# Patient Record
Sex: Male | Born: 1965 | Race: Black or African American | Hispanic: No | Marital: Single | Smoking: Former smoker
Health system: Southern US, Community
[De-identification: ages and names within clinical notes are randomized; demographics above are authoritative.]

## PROBLEM LIST (undated history)

## (undated) DIAGNOSIS — E7800 Pure hypercholesterolemia, unspecified: Secondary | ICD-10-CM

## (undated) DIAGNOSIS — Z8042 Family history of malignant neoplasm of prostate: Secondary | ICD-10-CM

## (undated) DIAGNOSIS — Z803 Family history of malignant neoplasm of breast: Secondary | ICD-10-CM

## (undated) DIAGNOSIS — M1990 Unspecified osteoarthritis, unspecified site: Secondary | ICD-10-CM

## (undated) DIAGNOSIS — M109 Gout, unspecified: Secondary | ICD-10-CM

## (undated) DIAGNOSIS — Z8041 Family history of malignant neoplasm of ovary: Secondary | ICD-10-CM

## (undated) DIAGNOSIS — S31139A Puncture wound of abdominal wall without foreign body, unspecified quadrant without penetration into peritoneal cavity, initial encounter: Secondary | ICD-10-CM

## (undated) DIAGNOSIS — W3400XA Accidental discharge from unspecified firearms or gun, initial encounter: Secondary | ICD-10-CM

## (undated) DIAGNOSIS — M6740 Ganglion, unspecified site: Secondary | ICD-10-CM

## (undated) HISTORY — DX: Unspecified osteoarthritis, unspecified site: M19.90

## (undated) HISTORY — PX: OTHER SURGICAL HISTORY: SHX169

## (undated) HISTORY — DX: Family history of malignant neoplasm of breast: Z80.3

## (undated) HISTORY — DX: Family history of malignant neoplasm of ovary: Z80.41

## (undated) HISTORY — DX: Family history of malignant neoplasm of prostate: Z80.42

## (undated) HISTORY — PX: APPENDECTOMY: SHX54

---

## 1998-03-03 ENCOUNTER — Emergency Department (HOSPITAL_COMMUNITY): Admission: EM | Admit: 1998-03-03 | Discharge: 1998-03-03 | Payer: Self-pay | Admitting: Emergency Medicine

## 1998-04-05 ENCOUNTER — Emergency Department (HOSPITAL_COMMUNITY): Admission: EM | Admit: 1998-04-05 | Discharge: 1998-04-05 | Payer: Self-pay

## 1998-07-05 ENCOUNTER — Encounter: Admission: RE | Admit: 1998-07-05 | Discharge: 1998-10-03 | Payer: Self-pay | Admitting: Family Medicine

## 1999-08-04 ENCOUNTER — Encounter: Payer: Self-pay | Admitting: General Surgery

## 1999-08-04 ENCOUNTER — Inpatient Hospital Stay (HOSPITAL_COMMUNITY): Admission: EM | Admit: 1999-08-04 | Discharge: 1999-08-25 | Payer: Self-pay | Admitting: General Surgery

## 1999-08-07 ENCOUNTER — Encounter: Payer: Self-pay | Admitting: General Surgery

## 1999-08-08 ENCOUNTER — Encounter: Payer: Self-pay | Admitting: General Surgery

## 1999-08-09 ENCOUNTER — Encounter: Payer: Self-pay | Admitting: *Deleted

## 1999-08-11 ENCOUNTER — Encounter: Payer: Self-pay | Admitting: General Surgery

## 1999-08-19 ENCOUNTER — Encounter: Payer: Self-pay | Admitting: Urology

## 1999-09-13 ENCOUNTER — Emergency Department (HOSPITAL_COMMUNITY): Admission: EM | Admit: 1999-09-13 | Discharge: 1999-09-13 | Payer: Self-pay | Admitting: *Deleted

## 1999-09-26 ENCOUNTER — Ambulatory Visit (HOSPITAL_COMMUNITY): Admission: RE | Admit: 1999-09-26 | Discharge: 1999-09-26 | Payer: Self-pay

## 1999-10-05 ENCOUNTER — Encounter: Payer: Self-pay | Admitting: Emergency Medicine

## 1999-10-05 ENCOUNTER — Emergency Department (HOSPITAL_COMMUNITY): Admission: EM | Admit: 1999-10-05 | Discharge: 1999-10-05 | Payer: Self-pay | Admitting: Emergency Medicine

## 1999-10-24 ENCOUNTER — Ambulatory Visit (HOSPITAL_COMMUNITY): Admission: RE | Admit: 1999-10-24 | Discharge: 1999-10-24 | Payer: Self-pay

## 2000-08-28 ENCOUNTER — Ambulatory Visit (HOSPITAL_BASED_OUTPATIENT_CLINIC_OR_DEPARTMENT_OTHER): Admission: RE | Admit: 2000-08-28 | Discharge: 2000-08-28 | Payer: Self-pay | Admitting: *Deleted

## 2001-05-29 ENCOUNTER — Emergency Department (HOSPITAL_COMMUNITY): Admission: EM | Admit: 2001-05-29 | Discharge: 2001-05-29 | Payer: Self-pay | Admitting: Emergency Medicine

## 2001-09-05 ENCOUNTER — Emergency Department (HOSPITAL_COMMUNITY): Admission: EM | Admit: 2001-09-05 | Discharge: 2001-09-05 | Payer: Self-pay | Admitting: Emergency Medicine

## 2001-10-22 DIAGNOSIS — S31139A Puncture wound of abdominal wall without foreign body, unspecified quadrant without penetration into peritoneal cavity, initial encounter: Secondary | ICD-10-CM

## 2001-10-22 DIAGNOSIS — W3400XA Accidental discharge from unspecified firearms or gun, initial encounter: Secondary | ICD-10-CM

## 2001-10-22 HISTORY — DX: Accidental discharge from unspecified firearms or gun, initial encounter: W34.00XA

## 2001-10-22 HISTORY — DX: Puncture wound of abdominal wall without foreign body, unspecified quadrant without penetration into peritoneal cavity, initial encounter: S31.139A

## 2001-10-22 HISTORY — PX: OTHER SURGICAL HISTORY: SHX169

## 2010-07-25 ENCOUNTER — Emergency Department (HOSPITAL_COMMUNITY): Admission: EM | Admit: 2010-07-25 | Discharge: 2010-07-25 | Payer: Self-pay | Admitting: Family Medicine

## 2011-01-04 LAB — POCT RAPID STREP A (OFFICE): Streptococcus, Group A Screen (Direct): NEGATIVE
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
## 2011-03-09 NOTE — Op Note (Signed)
[place]. [facility]  Patient:    Trevor Bean, Trevor Bean                      MRN: [id number] Proc. Date: 08/28/00 Adm. Date:  [id number] Attending:  Kendell Bane                           Operative Report  PREOPERATIVE DIAGNOSIS:  Chronic dislocation carpometacarpal joint, left thumb.  POSTOPERATIVE DIAGNOSIS:  Chronic dislocation carpometacarpal joint, left thumb.  OPERATION:  Open reduction and pinning carpometacarpal joint, left thumb.  SURGEON:  Lowell Bouton, M.D.  ANESTHESIA:  General  OPERATIVE FINDINGS:  The patient had mild degenerative changes at the [facility] joint.  The base of the metacarpal was 90% dislocated.  DESCRIPTION OF PROCEDURE:  Under general anesthesia with a tourniquet on the left arm, the left hand was prepped and draped in the usual fashion and a curved incision was made dorsally over the [facility] joint.  Blunt dissection was carried through the subcutaneous tissues and bleeding points were coagulated. Sharp dissection was carried down through the capsule longitudinally at the [facility] joint.  The capsule was elevated sharply, radially and ulnarly off of the trapezium and the [facility] joint was examined.  The metacarpal was 90% subluxed from the joint.  An arthrotomy was made at the [facility] joint and Freer elevator inserted to examine the joint.  There was some mild degenerative changes.  The capsule was completely released and the first metacarpal was reduced and pinned with 62 K-wire times two.  X-rays showed good alignment of the joint. The pins were bent over and left protruding from the skin.  The wound was irrigated with saline and closed with 4-0 Vicryl in the capsule and a 3-0 subcuticular Prolene in the skin.  Steri-Strips were applied followed by sterile dressings and thumb Spica splint. The patient tolerated the procedure well and went to the recovery room awake and stable in good condition. DD:  08/28/00 TD:   08/29/00 Job: [id number]
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
## 2011-06-07 ENCOUNTER — Emergency Department (HOSPITAL_COMMUNITY): Payer: Self-pay

## 2011-06-07 ENCOUNTER — Emergency Department (HOSPITAL_COMMUNITY)
Admission: EM | Admit: 2011-06-07 | Discharge: 2011-06-07 | Disposition: A | Discharge status: Home / Self Care | Payer: Self-pay | Attending: Emergency Medicine | Admitting: Emergency Medicine

## 2011-06-07 DIAGNOSIS — K921 Melena: Secondary | ICD-10-CM | POA: Insufficient documentation

## 2011-06-07 DIAGNOSIS — K5900 Constipation, unspecified: Secondary | ICD-10-CM | POA: Insufficient documentation

## 2011-06-07 DIAGNOSIS — R209 Unspecified disturbances of skin sensation: Secondary | ICD-10-CM | POA: Insufficient documentation

## 2011-06-07 DIAGNOSIS — R29898 Other symptoms and signs involving the musculoskeletal system: Secondary | ICD-10-CM | POA: Insufficient documentation

## 2011-06-07 DIAGNOSIS — E7800 Pure hypercholesterolemia, unspecified: Secondary | ICD-10-CM | POA: Insufficient documentation

## 2011-06-07 DIAGNOSIS — I10 Essential (primary) hypertension: Secondary | ICD-10-CM | POA: Insufficient documentation

## 2011-06-07 LAB — POCT I-STAT, CHEM 8
BUN: 16 mg/dL (ref 6–23)
Calcium, Ion: 1.14 mmol/L (ref 1.12–1.32)
Chloride: 106 mEq/L (ref 96–112)
Creatinine, Ser: 1 mg/dL (ref 0.50–1.35)
Glucose, Bld: 66 mg/dL — ABNORMAL LOW (ref 70–99)
HCT: 41 % (ref 39.0–52.0)
Hemoglobin: 13.9 g/dL (ref 13.0–17.0)
Potassium: 3.5 mEq/L (ref 3.5–5.1)
Sodium: 142 mEq/L (ref 135–145)
TCO2: 23 mmol/L (ref 0–100)

## 2011-08-12 ENCOUNTER — Emergency Department (HOSPITAL_COMMUNITY)
Admission: EM | Admit: 2011-08-12 | Discharge: 2011-08-13 | Discharge status: Against Medical Advice | Payer: Self-pay | Attending: Emergency Medicine | Admitting: Emergency Medicine

## 2011-08-12 DIAGNOSIS — Z0389 Encounter for observation for other suspected diseases and conditions ruled out: Secondary | ICD-10-CM | POA: Insufficient documentation

## 2012-01-19 ENCOUNTER — Emergency Department (HOSPITAL_COMMUNITY)
Admission: EM | Admit: 2012-01-19 | Discharge: 2012-01-20 | Disposition: A | Discharge status: Home / Self Care | Payer: Self-pay | Attending: Emergency Medicine | Admitting: Emergency Medicine

## 2012-01-19 ENCOUNTER — Encounter (HOSPITAL_COMMUNITY): Payer: Self-pay | Admitting: Emergency Medicine

## 2012-01-19 ENCOUNTER — Emergency Department (HOSPITAL_COMMUNITY): Payer: Self-pay

## 2012-01-19 DIAGNOSIS — S43402A Unspecified sprain of left shoulder joint, initial encounter: Secondary | ICD-10-CM

## 2012-01-19 DIAGNOSIS — IMO0002 Reserved for concepts with insufficient information to code with codable children: Secondary | ICD-10-CM | POA: Insufficient documentation

## 2012-01-19 NOTE — ED Notes (Signed)
PT. DID NOT LEAVE ER , SLEEPING AT WAITING AREA .
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
## 2012-01-20 MED ORDER — IBUPROFEN 600 MG PO TABS: 600.0000 mg | ORAL_TABLET | Freq: Four times a day (QID) | ORAL | Status: DC | PRN

## 2012-01-20 MED ORDER — OXYCODONE-ACETAMINOPHEN 5-325 MG PO TABS
1.0000 | ORAL_TABLET | Freq: Once | ORAL | Status: AC
  Administered 2012-01-20: 1 via ORAL
  Filled 2012-01-20: qty 1

## 2012-01-20 MED ORDER — HYDROCODONE-ACETAMINOPHEN 5-500 MG PO TABS: 1.0000 | ORAL_TABLET | Freq: Four times a day (QID) | ORAL | Status: DC | PRN

## 2012-01-20 NOTE — Discharge Instructions (Signed)
Take ibuprofen for pain. Take vicodin as prescribed as needed for severe pain. Ice your shoulder. Keep it in the sling. No lifting anything over 1lb. Follow up closely with orthopedics as referred. Return if high fever, or any new concerning symptoms  Shoulder Pain The shoulder is a ball and socket joint. The muscles and tendons (rotator cuff) are what keep the shoulder in its joint and stable. This collection of muscles and tendons holds in the head (ball) of the humerus (upper arm bone) in the fossa (cup) of the scapula (shoulder blade). Today no reason was found for your shoulder pain. Often pain in the shoulder may be treated conservatively with temporary immobilization. For example, holding the shoulder in one place using a sling for rest. Physical therapy may be needed if problems continue. HOME CARE INSTRUCTIONS   Apply ice to the sore area for 15 to 20 minutes, 3 to 4 times per day for the first 2 days. Put the ice in a plastic bag. Place a towel between the bag of ice and your skin.   If you have or were given a shoulder sling and straps, do not remove for as long as directed by your caregiver or until you see a caregiver for a follow-up examination. If you need to remove it to shower or bathe, move your arm as little as possible.   Sleep on several pillows at night to lessen swelling and pain.   Only take over-the-counter or prescription medicines for pain, discomfort, or fever as directed by your caregiver.   Keep any follow-up appointments in order to avoid any type of permanent shoulder disability or chronic pain problems.  SEEK MEDICAL CARE IF:   Pain in your shoulder increases or new pain develops in your arm, hand, or fingers.   Your hand or fingers are colder than your other hand.   You do not obtain pain relief with the medications or your pain becomes worse.  SEEK IMMEDIATE MEDICAL CARE IF:   Your arm, hand, or fingers are numb or tingling.   Your arm, hand, or fingers  are swollen, painful, or turn white or blue.   You develop chest pain or shortness of breath.  MAKE SURE YOU:   Understand these instructions.   Will watch your condition.   Will get help right away if you are not doing well or get worse.  Document Released: 07/18/2005 Document Revised: 09/27/2011 Document Reviewed: 09/22/2011 [facility] Patient Information 2012 [place], [place].
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
## 2012-01-21 NOTE — ED Provider Notes (Signed)
Medical screening examination/treatment/procedure(s) were performed by non-physician practitioner and as supervising physician I was immediately available for consultation/collaboration.   Tiffane Sheldon M Katyra Tomassetti, DO 01/21/12 0647 

## 2012-01-26 ENCOUNTER — Emergency Department (HOSPITAL_COMMUNITY)
Admission: EM | Admit: 2012-01-26 | Discharge: 2012-01-26 | Disposition: A | Discharge status: Home Health | Payer: Self-pay | Attending: Emergency Medicine | Admitting: Emergency Medicine

## 2012-01-26 ENCOUNTER — Encounter (HOSPITAL_COMMUNITY): Payer: Self-pay | Admitting: Nurse Practitioner

## 2012-01-26 DIAGNOSIS — S4990XA Unspecified injury of shoulder and upper arm, unspecified arm, initial encounter: Secondary | ICD-10-CM

## 2012-01-26 DIAGNOSIS — S46909A Unspecified injury of unspecified muscle, fascia and tendon at shoulder and upper arm level, unspecified arm, initial encounter: Secondary | ICD-10-CM | POA: Insufficient documentation

## 2012-01-26 DIAGNOSIS — S4980XA Other specified injuries of shoulder and upper arm, unspecified arm, initial encounter: Secondary | ICD-10-CM | POA: Insufficient documentation

## 2012-01-26 DIAGNOSIS — Y998 Other external cause status: Secondary | ICD-10-CM | POA: Insufficient documentation

## 2012-01-26 DIAGNOSIS — X500XXA Overexertion from strenuous movement or load, initial encounter: Secondary | ICD-10-CM | POA: Insufficient documentation

## 2012-01-26 DIAGNOSIS — Y9383 Activity, rough housing and horseplay: Secondary | ICD-10-CM | POA: Insufficient documentation

## 2012-01-26 HISTORY — DX: Puncture wound of abdominal wall without foreign body, unspecified quadrant without penetration into peritoneal cavity, initial encounter: S31.139A

## 2012-01-26 HISTORY — DX: Accidental discharge from unspecified firearms or gun, initial encounter: W34.00XA

## 2012-01-26 MED ORDER — HYDROCODONE-ACETAMINOPHEN 5-325 MG PO TABS: 1.0000 | ORAL_TABLET | ORAL | Status: AC | PRN

## 2012-01-26 NOTE — ED Notes (Addendum)
States he was horseplaying and his L arm was injured earlier this week. Pain in L shoulder since, "now there feels like a knot in my shoulder." reports he was seen here earlier this week for same. Cms intact
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
## 2012-01-26 NOTE — Discharge Instructions (Signed)
Rotator Cuff Injury  The rotator cuff is the collective set of muscles and tendons that make up the stabilizing unit of your shoulder. This unit holds in the ball of the humerus (upper arm bone) in the socket of the scapula (shoulder blade). Injuries to this stabilizing unit most commonly come from sports or activities that cause the arm to be moved repeatedly over the head. Examples of this include throwing, weight lifting, swimming, racquet sports, or an injury such as falling on your arm. Chronic (longstanding) irritation of this unit can cause inflammation (soreness), bursitis, and eventual damage to the tendons to the point of rupture (tear). An acute (sudden) injury of the rotator cuff can result in a partial or complete tear. You may need surgery with complete tears. Small or partial rotator cuff tears may be treated conservatively with temporary immobilization, exercises and rest. Physical therapy may be needed.  HOME CARE INSTRUCTIONS    Apply ice to the injury for 15 to 20 minutes 3 to 4 times per day for the first 2 days. Put the ice in a plastic bag and place a towel between the bag of ice and your skin.   If you have a shoulder immobilizer (sling and straps), do not remove it for as long as directed by your caregiver or until you see a caregiver for a follow-up examination. If you need to remove it, move your arm as little as possible.   You may want to sleep on several pillows or in a recliner at night to lessen swelling and pain.   Only take over-the-counter or prescription medicines for pain, discomfort, or fever as directed by your caregiver.   Do simple hand squeezing exercises with a soft rubber ball to decrease hand swelling.  SEEK MEDICAL CARE IF:    Pain in your shoulder increases or new pain or numbness develops in your arm, hand, or fingers.   Your hand or fingers are colder than your other hand.  SEEK IMMEDIATE MEDICAL CARE IF:    Your arm, hand, or fingers are numb or  tingling.   Your arm, hand, or fingers are increasingly swollen and painful, or turn white or blue.  Document Released: 10/05/2000 Document Revised: 09/27/2011 Document Reviewed: 09/28/2008  [employer] Patient Information 2012 [employer].

## 2012-11-16 ENCOUNTER — Encounter (HOSPITAL_COMMUNITY): Payer: Self-pay | Admitting: *Deleted

## 2012-11-16 ENCOUNTER — Emergency Department (INDEPENDENT_AMBULATORY_CARE_PROVIDER_SITE_OTHER)
Admission: EM | Admit: 2012-11-16 | Discharge: 2012-11-16 | Disposition: A | Discharge status: Home / Self Care | Payer: Self-pay | Source: Home / Self Care | Attending: Family Medicine | Admitting: Family Medicine

## 2012-11-16 ENCOUNTER — Emergency Department (INDEPENDENT_AMBULATORY_CARE_PROVIDER_SITE_OTHER): Payer: Self-pay

## 2012-11-16 DIAGNOSIS — M109 Gout, unspecified: Secondary | ICD-10-CM

## 2012-11-16 LAB — URIC ACID: Uric Acid, Serum: 5.2 mg/dL (ref 4.0–7.8)

## 2012-11-16 MED ORDER — INDOMETHACIN 50 MG PO CAPS: 50.0000 mg | ORAL_CAPSULE | Freq: Three times a day (TID) | ORAL | Status: DC

## 2012-11-16 MED ORDER — COLCHICINE 0.6 MG PO TABS: 0.6000 mg | ORAL_TABLET | Freq: Two times a day (BID) | ORAL | Status: DC
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
## 2012-11-29 ENCOUNTER — Telehealth (HOSPITAL_COMMUNITY): Payer: Self-pay | Admitting: Family Medicine

## 2012-11-29 NOTE — ED Notes (Signed)
Patient called today stating he needed something to help with the pain.  He states he was unable to afford the medication written on 11/16/2012.  Chart reviewed by Dr Artis Flock.  He stated for patient to fill the indomethacin.  Dr Artis Flock states it is inexpensive and will help with inflammation and pain.  Patient made aware and expressed understanding.  Patient notified to try [employer], [employer] or [employer] [employer] since their prices are usually the lowest.

## 2013-11-22 ENCOUNTER — Encounter (HOSPITAL_COMMUNITY): Payer: Self-pay | Admitting: Emergency Medicine

## 2013-11-22 ENCOUNTER — Emergency Department (INDEPENDENT_AMBULATORY_CARE_PROVIDER_SITE_OTHER)
Admission: EM | Admit: 2013-11-22 | Discharge: 2013-11-22 | Disposition: A | Discharge status: Home / Self Care | Payer: Self-pay | Source: Home / Self Care | Attending: Emergency Medicine | Admitting: Emergency Medicine

## 2013-11-22 DIAGNOSIS — M2550 Pain in unspecified joint: Secondary | ICD-10-CM

## 2013-11-22 DIAGNOSIS — K0401 Reversible pulpitis: Secondary | ICD-10-CM

## 2013-11-22 MED ORDER — METRONIDAZOLE 500 MG PO TABS: 500.0000 mg | ORAL_TABLET | Freq: Three times a day (TID) | ORAL | Status: DC

## 2013-11-22 MED ORDER — MELOXICAM 15 MG PO TABS: 15.0000 mg | ORAL_TABLET | Freq: Every day | ORAL | Status: DC

## 2013-11-22 MED ORDER — AMOXICILLIN 500 MG PO CAPS: 500.0000 mg | ORAL_CAPSULE | Freq: Three times a day (TID) | ORAL | Status: DC

## 2013-11-22 MED ORDER — TRAMADOL HCL 50 MG PO TABS: 100.0000 mg | ORAL_TABLET | Freq: Three times a day (TID) | ORAL | Status: DC | PRN

## 2013-11-22 NOTE — ED Notes (Signed)
Pt        Reports        Both  Feet         Are  painfull  And     Swelling        As  Well as  Both  Hands - he  Reports   As  Well that  He  Has  A  Toothache  With  Pain /  Swelling to r  Side  Face         These  Symptoms  Have  Been present      For      sev  Weeks

## 2013-11-22 NOTE — ED Provider Notes (Signed)
Chief Complaint    Chief Complaint  Patient presents with  . Foot Pain    History of Present Illness     Trevor Bean is a 48 year old male who comes in today with a long-standing history of pain in his hands and feet. He was here about a year ago with the same thing. At that time he was diagnosed as having gout. Despite the diagnosis, his uric acid level was normal. His x-ray of his foot was normal. He has pain in the MTP joints of both feet and the MCP joints of both hands and this is been going on for years. It hurts to stand and use his hands. He has been let go from his job due to pain. He notes swelling, stiffness, and morning stiffness lasting for hours to all day at a time. He denies any other joint pain.  His other complaint has been a toothache in his right upper dentition. This appears to involve 2 teeth, both right, upper, bicuspids. There is some swelling of the gingiva in that area, but no purulent drainage. He has no difficulty swallowing or breathing.  Review of Systems     Other than as noted above, the patient denies any of the following symptoms: Systemic:  No fevers, chills, sweats, or muscle aches.  No weight loss.  Musculoskeletal:  No joint pain, arthritis, bursitis, swelling, back pain, or neck pain. Neurological:  No muscular weakness, paresthesias, headache, or trouble with speech or coordination.  No dizziness.  [place]    Past medical history, family history, social history, meds, and allergies were reviewed.    Physical Exam    Vital signs:  BP 121/81  Pulse 79  Temp(Src) 98.5 F (36.9 C) (Oral)  Resp 19  SpO2 96% Gen:  Alert and oriented times 3.  In no distress. HEENT: He has several decayed teeth, both upper left premolars with some swelling of the gingiva as well. Musculoskeletal: Exam of his joints reveals pain to palpation in the MCP joints of the hands and MTP joints of the toes. There is no obvious swelling or deformity. No erythema or heat.   Otherwise, all joints had a full a ROM with no swelling, bruising or deformity.  No edema, pulses full. Extremities were warm and pink.  Capillary refill was brisk.  Skin:  Clear, warm and dry.  No rash. Neuro:  Alert and oriented times 3.  Muscle strength was normal.  Sensation was intact to light touch.   Assessment    The primary encounter diagnosis was Pulpitis. A diagnosis of Arthralgia was also pertinent to this visit.  I don't think he has acute gout. Differential diagnosis includes chronic gouty arthritis, pseudogout, osteoarthritis, or rheumatoid arthritis. He needs a complete rheumatological workup. I have referred [facility] to the [facility] and [facility] for further evaluation.  Plan   1.  Meds:  The following meds were prescribed:   Discharge Medication List as of 11/22/2013  3:13 PM    START taking these medications   Details  amoxicillin (AMOXIL) 500 MG capsule Take 1 capsule (500 mg total) by mouth 3 (three) times daily., Starting 11/22/2013, Until Discontinued, Normal    meloxicam (MOBIC) 15 MG tablet Take 1 tablet (15 mg total) by mouth daily., Starting 11/22/2013, Until Discontinued, Normal    metroNIDAZOLE (FLAGYL) 500 MG tablet Take 1 tablet (500 mg total) by mouth 3 (three) times daily., Starting 11/22/2013, Until Discontinued, Normal    traMADol (ULTRAM) 50 MG tablet Take  2 tablets (100 mg total) by mouth every 8 (eight) hours as needed., Starting 11/22/2013, Until Discontinued, Normal        2.  Patient Education/Counseling:  The patient was given appropriate handouts, self care instructions, and instructed in symptomatic relief, including rest and activity, and elevation.  3.  Follow up:  The patient was told to follow up here if no better in 3 to 4 days, or sooner if becoming worse in any way, and given some red flag symptoms such as worsening pain or new neurological symptoms which would prompt immediate return.  Follow up here as needed. Suggest he needs to see a  dentist as soon as possible. He does not have any funds and cannot afford one. He was given a list of dental resources.     Harden Mo, MD 11/22/13 2049

## 2013-11-22 NOTE — Discharge Instructions (Signed)
Arthralgia °Your caregiver has diagnosed you as suffering from an arthralgia. Arthralgia means there is pain in a joint. This can come from many reasons including: °· Bruising the joint which causes soreness (inflammation) in the joint. °· Wear and tear on the joints which occur as we grow older (osteoarthritis). °· Overusing the joint. °· Various forms of arthritis. °· Infections of the joint. °Regardless of the cause of pain in your joint, most of these different pains respond to anti-inflammatory drugs and rest. The exception to this is when a joint is infected, and these cases are treated with antibiotics, if it is a bacterial infection. °HOME CARE INSTRUCTIONS  °· Rest the injured area for as long as directed by your caregiver. Then slowly start using the joint as directed by your caregiver and as the pain allows. Crutches as directed may be useful if the ankles, knees or hips are involved. If the knee was splinted or casted, continue use and care as directed. If an stretchy or elastic wrapping bandage has been applied today, it should be removed and re-applied every 3 to 4 hours. It should not be applied tightly, but firmly enough to keep swelling down. Watch toes and feet for swelling, bluish discoloration, coldness, numbness or excessive pain. If any of these problems (symptoms) occur, remove the ace bandage and re-apply more loosely. If these symptoms persist, contact your caregiver or return to this location. °· For the first 24 hours, keep the injured extremity elevated on pillows while lying down. °· Apply ice for 15-20 minutes to the sore joint every couple hours while awake for the first half day. Then 03-04 times per day for the first 48 hours. Put the ice in a plastic bag and place a towel between the bag of ice and your skin. °· Wear any splinting, casting, elastic bandage applications, or slings as instructed. °· Only take over-the-counter or prescription medicines for pain, discomfort, or fever as  directed by your caregiver. Do not use aspirin immediately after the injury unless instructed by your physician. Aspirin can cause increased bleeding and bruising of the tissues. °· If you were given crutches, continue to use them as instructed and do not resume weight bearing on the sore joint until instructed. °Persistent pain and inability to use the sore joint as directed for more than 2 to 3 days are warning signs indicating that you should see a caregiver for a follow-up visit as soon as possible. Initially, a hairline fracture (break in bone) may not be evident on X-rays. Persistent pain and swelling indicate that further evaluation, non-weight bearing or use of the joint (use of crutches or slings as instructed), or further X-rays are indicated. X-rays may sometimes not show a small fracture until a week or 10 days later. Make a follow-up appointment with your own caregiver or one to whom we have referred you. A radiologist (specialist in reading X-rays) may read your X-rays. Make sure you know how you are to obtain your X-ray results. Do not assume everything is normal if you do not hear from us. °SEEK MEDICAL CARE IF: °Bruising, swelling, or pain increases. °SEEK IMMEDIATE MEDICAL CARE IF:  °· Your fingers or toes are numb or blue. °· The pain is not responding to medications and continues to stay the same or get worse. °· The pain in your joint becomes severe. °· You develop a fever over 102° F (38.9° C). °· It becomes impossible to move or use the joint. °MAKE SURE YOU:  °·   Understand these instructions.  Will watch your condition.  Will get help right away if you are not doing well or get worse. Document Released: 10/08/2005 Document Revised: 12/31/2011 Document Reviewed: 05/26/2008 [facility] Patient Information 2014 [place].   Look up the [place] of [facility] for free dental clinics. undoomedical.com.asp  Get there early and be  prepared to wait. [employer] and [employer] have [employer]writer, advertising schools that provide low cost routine dental care.   Other resources: [facility] [place], [place] [phone]  Patients with Medicaid: [place] [address] [place] [employer] Phone:  [phone]                                                  Phone:  [phone]  If unable to pay or uninsured, contact:  [facility] or [facility]. to become qualified for the adult dental clinic.  No matter what dental problem you have, it will not get better unless you get good dental care.  If the tooth is not taken care of, your symptoms will come back in time and you will be visiting [place] again in the Urgent [place] with a bad toothache.  So, see your dentist as soon as possible.  If you don't have a dentist, we can give you a list of dentists.  Sometimes the most cost effective treatment is removal of the tooth.  This can be done very inexpensively through one of the low cost Dance movement psychotherapist such as the facility on [facility] in [place] [phone]).  The downside to this is that you will have one less tooth and this can effect your ability to chew.  Some other things that can be done for a dental infection include the following:   Rinse your mouth out with hot salt water (1/2 tsp of table salt and a pinch of baking soda in 8 oz of hot water).  You can do this every 2 or 3 hours.  Avoid cold foods, beverages, and cold air.  This will make your symptoms worse.  Sleep with your head elevated.  Sleeping flat will cause your gums and oral tissues to swell and make them hurt more.  You can sleep on several pillows.  Even better is to sleep in a recliner with your head higher than your heart.  For mild to moderate pain, you can take Tylenol, ibuprofen, or Aleve.  External  application of heat by a heating pad, hot water bottle, or hot wet towel can help with pain and speed healing.  You can do this every 2 to 3 hours. Do not fall asleep on a heating pad since this can cause a burn.   Go to www.goodrx.com to look up your medications. This will give you a list of where you can find your prescriptions at the most affordable prices.   RESOURCE GUIDE  Dental Problems  Look up SuperiorMarketers.be.asp for a schedule of the [place] [employer]'s free  dental clinics called [place]. They have clinics all around [place]. Get there early and be prepared to wait.   [facility] [address]  [place], [place] [address] [phone]  [facility] [place], [place] [phone]  Patients with Medicaid: [facility] [phone] [address] Phone:  [phone]                                                  Phone:  [phone]  If unable to pay or uninsured, contact:  [facility] or [facility]. to become qualified for the adult dental clinic.

## 2014-02-09 ENCOUNTER — Emergency Department (INDEPENDENT_AMBULATORY_CARE_PROVIDER_SITE_OTHER)
Admission: EM | Admit: 2014-02-09 | Discharge: 2014-02-09 | Disposition: A | Discharge status: Home / Self Care | Payer: No Typology Code available for payment source | Source: Home / Self Care | Attending: Emergency Medicine | Admitting: Emergency Medicine

## 2014-02-09 ENCOUNTER — Encounter (HOSPITAL_COMMUNITY): Payer: Self-pay | Admitting: Emergency Medicine

## 2014-02-09 DIAGNOSIS — M79676 Pain in unspecified toe(s): Secondary | ICD-10-CM

## 2014-02-09 DIAGNOSIS — K0401 Reversible pulpitis: Secondary | ICD-10-CM

## 2014-02-09 DIAGNOSIS — M79609 Pain in unspecified limb: Secondary | ICD-10-CM

## 2014-02-09 DIAGNOSIS — R490 Dysphonia: Secondary | ICD-10-CM

## 2014-02-09 DIAGNOSIS — J029 Acute pharyngitis, unspecified: Secondary | ICD-10-CM

## 2014-02-09 MED ORDER — TRAMADOL HCL 50 MG PO TABS: 100.0000 mg | ORAL_TABLET | Freq: Three times a day (TID) | ORAL | Status: DC | PRN

## 2014-02-09 MED ORDER — AMOXICILLIN 500 MG PO CAPS: 500.0000 mg | ORAL_CAPSULE | Freq: Three times a day (TID) | ORAL | Status: DC

## 2014-02-09 MED ORDER — OMEPRAZOLE 20 MG PO CPDR: 20.0000 mg | DELAYED_RELEASE_CAPSULE | Freq: Two times a day (BID) | ORAL | Status: DC

## 2014-02-09 MED ORDER — MELOXICAM 15 MG PO TABS: 15.0000 mg | ORAL_TABLET | Freq: Every day | ORAL | Status: DC

## 2014-02-09 MED ORDER — METRONIDAZOLE 500 MG PO TABS: 500.0000 mg | ORAL_TABLET | Freq: Three times a day (TID) | ORAL | Status: DC

## 2014-02-09 NOTE — Discharge Instructions (Signed)
Look up the [place] of [facility] for free dental clinics. undoomedical.com.asp  Get there early and be prepared to wait. [employer] and [employer] have [employer]writer, advertising schools that provide low cost routine dental care.   Other resources: [facility] [place], [place] [phone]  Patients with Medicaid: [place] [address] [place] [employer] Phone:  [phone]                                                  Phone:  [phone]  If unable to pay or uninsured, contact:  [facility] or [facility]. to become qualified for the adult dental clinic.  No matter what dental problem you have, it will not get better unless you get good dental care.  If the tooth is not taken care of, your symptoms will come back in time and you will be visiting [place] again in the Urgent [place] with a bad toothache.  So, see your dentist as soon as possible.  If you don't have a dentist, we can give you a list of dentists.  Sometimes the most cost effective treatment is removal of the tooth.  This can be done very inexpensively through one of the low cost Dance movement psychotherapist such as the facility on [facility] in [place] [phone]).  The downside to this is that you will have one less tooth and this can effect your ability to chew.  Some other things that can be done for a dental infection include the following:   Rinse your mouth out with hot salt water (1/2 tsp of table salt and a pinch of baking soda in 8 oz of hot water).  You can do this every 2 or 3 hours.  Avoid cold foods, beverages, and cold air.  This will make your symptoms worse.  Sleep with your head elevated.  Sleeping flat will cause your gums and oral tissues to swell and make them hurt more.  You can sleep on several pillows.  Even  better is to sleep in a recliner with your head higher than your heart.  For mild to moderate pain, you can take Tylenol, ibuprofen, or Aleve.  External application of heat by a heating pad, hot water bottle, or hot wet towel can help with pain and speed healing.  You can do this every 2 to 3 hours. Do not fall asleep on a heating pad since this can cause a burn.   Go to www.goodrx.com to look up your medications. This will give you a list of where you can find your prescriptions at the most affordable prices.   RESOURCE GUIDE  Dental Problems  Look up SuperiorMarketers.be.asp for a schedule of the [place] [employer]'s free dental clinics called [place]. They have clinics all around [place]. Get there early and be prepared to wait.   [facility] [address]  [place], [place] [address] [phone]  [facility]  [place], [place] [phone]  Patients with Medicaid: [place] [address] [place] [employer] Phone:  [phone]                                                  Phone:  [phone]  If unable to pay or uninsured, contact:  [facility] or [facility]. to become qualified for the adult dental clinic.  Diet for Gastroesophageal Reflux Disease, Adult Reflux (acid reflux) is when acid from your stomach flows up into the esophagus. When acid comes in contact with the esophagus, the acid causes irritation and soreness (inflammation) in the esophagus. When reflux happens often or so severely that it causes damage to the esophagus, it is called gastroesophageal reflux disease (GERD). Nutrition therapy can help ease the discomfort of GERD. FOODS OR DRINKS TO AVOID OR LIMIT  Smoking or chewing tobacco. Nicotine is one of the most potent stimulants to acid production in the  gastrointestinal tract.  Caffeinated and decaffeinated coffee and black tea.  Regular or low-calorie carbonated beverages or energy drinks (caffeine-free carbonated beverages are allowed).   Strong spices, such as black pepper, white pepper, red pepper, cayenne, curry powder, and chili powder.  Peppermint or spearmint.  Chocolate.  High-fat foods, including meats and fried foods. Extra added fats including oils, butter, salad dressings, and nuts. Limit these to less than 8 tsp per day.  Fruits and vegetables if they are not tolerated, such as citrus fruits or tomatoes.  Alcohol.  Any food that seems to aggravate your condition. If you have questions regarding your diet, call your caregiver or a registered dietitian. OTHER THINGS THAT MAY HELP GERD INCLUDE:   Eating your meals slowly, in a relaxed setting.  Eating 5 to 6 small meals per day instead of 3 large meals.  Eliminating food for a period of time if it causes distress.  Not lying down until 3 hours after eating a meal.  Keeping the head of your bed raised 6 to 9 inches (15 to 23 cm) by using a foam wedge or blocks under the legs of the bed. Lying flat may make symptoms worse.  Being physically active. Weight loss may be helpful in reducing reflux in overweight or obese adults.  Wear loose fitting clothing EXAMPLE MEAL PLAN This meal plan is approximately 2,000 calories based on [employer] meal planning guidelines. Breakfast   cup cooked oatmeal.  1 cup strawberries.  1 cup low-fat milk.  1 oz almonds. Snack  1 cup cucumber slices.  6 oz yogurt (made from low-fat or fat-free milk). Lunch  2 slice whole-wheat bread.  2 oz sliced [place].  2 tsp mayonnaise.  1 cup blueberries.  1 cup snap peas. Snack  6 whole-wheat crackers.  1 oz string cheese. Dinner   cup brown rice.  1 cup mixed veggies.  1 tsp olive oil.  3 oz grilled fish. Document Released: 10/08/2005 Document Revised:  12/31/2011 Document Reviewed: 08/24/2011 [facility] Patient Information 2014 [place], [place].

## 2014-02-09 NOTE — ED Notes (Signed)
C/o pain in tooth and sore throat; swelling ??

## 2014-02-09 NOTE — ED Provider Notes (Signed)
Chief Complaint   Chief Complaint  Patient presents with  . Oral Swelling    History of Present Illness   Trevor Bean is a 48 year old homeless male who has had a one-month history of sore throat, and hoarseness. He has spit up some blood. He's had some nasal congestion, postnasal drainage, and cough. He's also had a several month history of toothache in the right upper dentition. Several of his teeth are extremely decayed. He was here a couple of months ago for the same thing. He was given a course of antibiotics. This got better but then the pain came back again. The patient states he cannot afford to see a dentist could not afford to have the people. He has pain on the right side of his neck. He denies any fever or chills. He's had no night sweats. He has had allergy symptoms with nasal congestion, rhinorrhea, and sneezing. He's not taking anything for that.   Review of Systems   Other than as noted above, the patient denies any of the following symptoms. Systemic:  No fever, chills, sweats, myalgias, or headache. Eye:  No redness, pain or drainage. ENT:  No earache, nasal congestion, sneezing, rhinorrhea, sinus pressure, sinus pain, or post nasal drip. Lungs:  No cough, sputum production, wheezing, shortness of breath, or chest pain. GI:  No abdominal pain, nausea, vomiting, or diarrhea. Skin:  No rash.  [place]   Past medical history, family history, social history, meds, and allergies were reviewed.   Physical Exam     Vital signs:  BP 109/68  Pulse 63  Temp(Src) 98.2 F (36.8 C) (Oral)  Resp 10  SpO2 100% General:  Alert, in no distress. Phonation was normal, no drooling, and patient was able to handle secretions well.  Eye:  No conjunctival injection or drainage. Lids were normal. ENT:  TMs and canals were normal, without erythema or inflammation.  Nasal mucosa was clear and uncongested, without drainage.  Mucous membranes were moist.  Exam of pharynx reveals no  swelling, no erythema, no drainage, no exudate, the throat appears completely normal.  There were no oral ulcerations or lesions. There was no bulging of the tonsillar pillars, and the uvula was midline. He has widespread dental decay particularly involving the right upper dentition, mostly the second premolar and first molar. These are badly decayed and tender to touch. There is no swelling of the gingiva no collection of pus, floor the mouth appears normal. Neck:  Supple, no adenopathy, tenderness or mass. Lungs:  No respiratory distress.  Lungs were clear to auscultation, without wheezes, rales or rhonchi.  Breath sounds were clear and equal bilaterally.  Heart:  Regular rhythm, without gallops, murmers or rubs. Skin:  Clear, warm, and dry, without rash or lesions.  Assessment   The primary encounter diagnosis was Pulpitis. Diagnoses of Hoarseness, Sore throat, and Toe pain were also pertinent to this visit.  There is no evidence of a peritonsillar abscess.  He needs to get in to a primary care physician first so he can be referred to ear nose and throat doctor and the dentist. I called the [facility] and they have him on the waiting list. He'll be seen there at least by July 1. That was the best thing to do for me. He needs to see them first, since he has an orange card.  Plan     1.  Meds:  The following meds were prescribed:   Discharge Medication List as  of 02/09/2014  2:31 PM    START taking these medications   Details  !! amoxicillin (AMOXIL) 500 MG capsule Take 1 capsule (500 mg total) by mouth 3 (three) times daily., Starting 02/09/2014, Until Discontinued, Print    !! meloxicam (MOBIC) 15 MG tablet Take 1 tablet (15 mg total) by mouth daily., Starting 02/09/2014, Until Discontinued, Print    !! metroNIDAZOLE (FLAGYL) 500 MG tablet Take 1 tablet (500 mg total) by mouth 3 (three) times daily., Starting 02/09/2014, Until Discontinued, Print    omeprazole  (PRILOSEC) 20 MG capsule Take 1 capsule (20 mg total) by mouth 2 (two) times daily before a meal., Starting 02/09/2014, Until Discontinued, Print    !! traMADol (ULTRAM) 50 MG tablet Take 2 tablets (100 mg total) by mouth every 8 (eight) hours as needed., Starting 02/09/2014, Until Discontinued, Print     !! - Potential duplicate medications found. Please discuss with provider.      2.  Patient Education/Counseling:  The patient was given appropriate handouts, self care instructions, and instructed in symptomatic relief, including hot saline gargles, throat lozenges, infectious precautions, and need to trade out toothbrush.    3.  Follow up:  The patient was told to follow up here if no better in 3 to 4 days, or sooner if becoming worse in any way, and given some red flag symptoms such as difficulty swallowing or breathing which would prompt immediate return.       Harden Mo, MD 02/09/14 [phone]

## 2014-04-02 ENCOUNTER — Emergency Department (HOSPITAL_COMMUNITY)
Admission: EM | Admit: 2014-04-02 | Discharge: 2014-04-04 | Disposition: A | Discharge status: Home / Self Care | Payer: No Typology Code available for payment source | Attending: Emergency Medicine | Admitting: Emergency Medicine

## 2014-04-02 ENCOUNTER — Encounter (HOSPITAL_COMMUNITY): Payer: Self-pay | Admitting: Emergency Medicine

## 2014-04-02 DIAGNOSIS — Z87828 Personal history of other (healed) physical injury and trauma: Secondary | ICD-10-CM | POA: Insufficient documentation

## 2014-04-02 DIAGNOSIS — Z79899 Other long term (current) drug therapy: Secondary | ICD-10-CM | POA: Insufficient documentation

## 2014-04-02 DIAGNOSIS — F17200 Nicotine dependence, unspecified, uncomplicated: Secondary | ICD-10-CM | POA: Insufficient documentation

## 2014-04-02 DIAGNOSIS — F1910 Other psychoactive substance abuse, uncomplicated: Secondary | ICD-10-CM

## 2014-04-02 DIAGNOSIS — F1210 Cannabis abuse, uncomplicated: Secondary | ICD-10-CM | POA: Insufficient documentation

## 2014-04-02 DIAGNOSIS — F1410 Cocaine abuse, uncomplicated: Secondary | ICD-10-CM | POA: Insufficient documentation

## 2014-04-02 DIAGNOSIS — M109 Gout, unspecified: Secondary | ICD-10-CM | POA: Insufficient documentation

## 2014-04-02 HISTORY — DX: Pure hypercholesterolemia, unspecified: E78.00

## 2014-04-02 HISTORY — DX: Gout, unspecified: M10.9

## 2014-04-02 LAB — CBC
HEMATOCRIT: 37.9 % — AB (ref 39.0–52.0)
Hemoglobin: 12.4 g/dL — ABNORMAL LOW (ref 13.0–17.0)
MCH: 24.2 pg — ABNORMAL LOW (ref 26.0–34.0)
MCHC: 32.7 g/dL (ref 30.0–36.0)
MCV: 73.9 fL — AB (ref 78.0–100.0)
PLATELETS: 220 10*3/uL (ref 150–400)
RBC: 5.13 MIL/uL (ref 4.22–5.81)
RDW: 14.9 % (ref 11.5–15.5)
WBC: 4.3 10*3/uL (ref 4.0–10.5)

## 2014-04-02 LAB — RAPID URINE DRUG SCREEN, HOSP PERFORMED
Amphetamines: NOT DETECTED
BENZODIAZEPINES: NOT DETECTED
Barbiturates: NOT DETECTED
COCAINE: POSITIVE — AB
OPIATES: NOT DETECTED
Tetrahydrocannabinol: POSITIVE — AB

## 2014-04-02 LAB — COMPREHENSIVE METABOLIC PANEL
ALBUMIN: 3.6 g/dL (ref 3.5–5.2)
ALT: 25 U/L (ref 0–53)
AST: 29 U/L (ref 0–37)
Alkaline Phosphatase: 63 U/L (ref 39–117)
BUN: 15 mg/dL (ref 6–23)
CALCIUM: 8.8 mg/dL (ref 8.4–10.5)
CO2: 25 mEq/L (ref 19–32)
CREATININE: 0.87 mg/dL (ref 0.50–1.35)
Chloride: 105 mEq/L (ref 96–112)
GFR calc Af Amer: >90 mL/min (ref >90)
GFR calc non Af Amer: >90 mL/min (ref >90)
Glucose, Bld: 132 mg/dL — ABNORMAL HIGH (ref 70–99)
Potassium: 3.9 mEq/L (ref 3.7–5.3)
Sodium: 143 mEq/L (ref 137–147)
TOTAL PROTEIN: 7.2 g/dL (ref 6.0–8.3)
Total Bilirubin: 0.4 mg/dL (ref 0.3–1.2)

## 2014-04-02 LAB — SALICYLATE LEVEL

## 2014-04-02 LAB — ACETAMINOPHEN LEVEL: Acetaminophen (Tylenol), Serum: <15 ug/mL (ref 10–30)

## 2014-04-02 LAB — ETHANOL

## 2014-04-02 NOTE — ED Notes (Signed)
Last time ETOh consumed 2-3 days ago "a few six packs". Last time used street drugs: yesterday.
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
## 2014-04-02 NOTE — ED Notes (Signed)
TTS in progress 

## 2014-04-02 NOTE — BH Assessment (Signed)
Received call for assessment. Spoke with Ernestina Patches, MD who said Pt has been binging on cocaine, alcohol, marijuana and narcotic pain medications. Tele-assessment will be initiated.  Orpah Greek Rosana Hoes, [facility] Triage Specialist [phone]

## 2014-04-02 NOTE — ED Notes (Signed)
[facility] went to [facility] for detox and they told him to come here for medical clearance first. He reports he uses cocaine, weed and/or alcohol most days of the week. He denies SI/HI. No physical complaints now

## 2014-04-03 MED ORDER — PANTOPRAZOLE SODIUM 40 MG PO TBEC
40.0000 mg | DELAYED_RELEASE_TABLET | Freq: Every day | ORAL | Status: DC
  Administered 2014-04-03: 40 mg via ORAL
  Filled 2014-04-03: qty 1

## 2014-04-03 NOTE — ED Notes (Signed)
Pt. Given crackers and sprite.
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
## 2014-04-03 NOTE — ED Notes (Signed)
Pt using phone at desk.

## 2014-04-03 NOTE — ED Notes (Signed)
Pt. Given a sprite.   

## 2014-04-03 NOTE — Progress Notes (Addendum)
The following detox facilities have been contacted:  [facility]- per Ivin Booty 1 bed available, referral faxed [facility]- per [facility] beds available, referral faxed [facility]- per Arbie Cookey currently at capacity but may beds available later today after d/c's  UPDATE: 1120 Per Ivin Booty at [facility] pt has been declined d/t aggression.  States that if [facility] doesn't feel comfortable accepting pt d/t aggression they don't feel comfortable accepting either. 1628 per Lovey Newcomer pt has been declined at [facility] d/t aggressive and combative behavior    Rick Duff Disposition MHT

## 2014-04-04 NOTE — Progress Notes (Signed)
MHT contacted [facility] and completed referral with [facility]#[id number] good thru 04/09/14.  Referral was faxed to [facility] for medical review on 04/05/14.  Wyvonnia Dusky, MHT/NS

## 2014-04-04 NOTE — ED Notes (Signed)
Dr Zenia Resides in to evaluate and discuss discharge plan with pt

## 2014-04-04 NOTE — BH Assessment (Signed)
[place] Assessment Progress Note  Resources for [facility] and [facility] faxed over to pt's nurse in the event that pt cannot be placed today.  Pt has an authorization to go to [facility] and he can follow up with them.  Shaune Pascal, MS, [facility] Licensed Professional Counselor Triage Specialist

## 2014-04-04 NOTE — ED Provider Notes (Signed)
Spoke with TTS today and they are working on placement. Patient has no signs of withdrawal this time. The plan will be to either place the patient today or patient be discharged to home with resources  Leota Jacobsen, MD 04/04/14 [phone]

## 2014-04-04 NOTE — Discharge Instructions (Signed)
Followup at [facility] as it you have been instructed Polysubstance Abuse When people abuse more than one drug or type of drug it is called polysubstance or polydrug abuse. For example, many smokers also drink alcohol. This is one form of polydrug abuse. Polydrug abuse also refers to the use of a drug to counteract an unpleasant effect produced by another drug. It may also be used to help with withdrawal from another drug. People who take stimulants may become agitated. Sometimes this agitation is countered with a tranquilizer. This helps protect against the unpleasant side effects. Polydrug abuse also refers to the use of different drugs at the same time.  Anytime drug use is interfering with normal living activities, it has become abuse. This includes problems with family and friends. Psychological dependence has developed when your mind tells you that the drug is needed. This is usually followed by physical dependence which has developed when continuing increases of drug are required to get the same feeling or "high". This is known as addiction or chemical dependency. A person's risk is much higher if there is a history of chemical dependency in the family. SIGNS OF CHEMICAL DEPENDENCY  You have been told by friends or family that drugs have become a problem.  You fight when using drugs.  You are having blackouts (not remembering what you do while using).  You feel sick from using drugs but continue using.  You lie about use or amounts of drugs (chemicals) used.  You need chemicals to get you going.  You are suffering in work performance or in school because of drug use.  You get sick from use of drugs but continue to use anyway.  You need drugs to relate to people or feel comfortable in social situations.  You use drugs to forget problems. "Yes" answered to any of the above signs of chemical dependency indicates there are problems. The longer the use of drugs continues, the greater the  problems will become. If there is a family history of drug or alcohol use, it is best not to experiment with these drugs. Continual use leads to tolerance. After tolerance develops more of the drug is needed to get the same feeling. This is followed by addiction. With addiction, drugs become the most important part of life. It becomes more important to take drugs than participate in the other usual activities of life. This includes relating to friends and family. Addiction is followed by dependency. Dependency is a condition where drugs are now needed not just to get high, but to feel normal. Addiction cannot be cured but it can be stopped. This often requires outside help and the care of professionals. Treatment centers are listed in the yellow pages under: Cocaine, Narcotics, and Alcoholics Anonymous. Most hospitals and clinics can refer you to a specialized care center. Talk to your caregiver if you need help. Document Released: 05/30/2005 Document Revised: 12/31/2011 Document Reviewed: 10/08/2005 [facility] Patient Information 2014 [place].

## 2014-04-04 NOTE — ED Notes (Signed)
PT BELONGINGS AND VALUABLES RETURNED TO HIM. HE HAS SIGNED RECIPT OF THEM

## 2014-04-04 NOTE — ED Notes (Signed)
Fax received from [facility].

## 2014-04-04 NOTE — ED Notes (Signed)
PER KRISTEN ON BRIDGE CALL PT WILL RECEIVE [facility] INFORMATION TO CALL ON Monday AND INFORMATION ON [place]. HE IS TO BE DISCHARGED HOME THIS MORNING WITH THESE REFERRALS

## 2014-04-07 ENCOUNTER — Emergency Department (HOSPITAL_BASED_OUTPATIENT_CLINIC_OR_DEPARTMENT_OTHER)
Admission: EM | Admit: 2014-04-07 | Discharge: 2014-04-07 | Disposition: A | Discharge status: Home / Self Care | Payer: No Typology Code available for payment source | Attending: Emergency Medicine | Admitting: Emergency Medicine

## 2014-04-07 ENCOUNTER — Encounter (HOSPITAL_BASED_OUTPATIENT_CLINIC_OR_DEPARTMENT_OTHER): Payer: Self-pay | Admitting: Emergency Medicine

## 2014-04-07 DIAGNOSIS — Z792 Long term (current) use of antibiotics: Secondary | ICD-10-CM | POA: Insufficient documentation

## 2014-04-07 DIAGNOSIS — M109 Gout, unspecified: Secondary | ICD-10-CM | POA: Insufficient documentation

## 2014-04-07 DIAGNOSIS — K0381 Cracked tooth: Secondary | ICD-10-CM | POA: Insufficient documentation

## 2014-04-07 DIAGNOSIS — F17200 Nicotine dependence, unspecified, uncomplicated: Secondary | ICD-10-CM | POA: Insufficient documentation

## 2014-04-07 DIAGNOSIS — K089 Disorder of teeth and supporting structures, unspecified: Secondary | ICD-10-CM | POA: Insufficient documentation

## 2014-04-07 DIAGNOSIS — Z87828 Personal history of other (healed) physical injury and trauma: Secondary | ICD-10-CM | POA: Insufficient documentation

## 2014-04-07 DIAGNOSIS — K0889 Other specified disorders of teeth and supporting structures: Secondary | ICD-10-CM

## 2014-04-07 DIAGNOSIS — Z79899 Other long term (current) drug therapy: Secondary | ICD-10-CM | POA: Insufficient documentation

## 2014-04-07 MED ORDER — IBUPROFEN 800 MG PO TABS: 800.0000 mg | ORAL_TABLET | Freq: Three times a day (TID) | ORAL | Status: DC

## 2014-04-07 MED ORDER — AMOXICILLIN 500 MG PO CAPS: 500.0000 mg | ORAL_CAPSULE | Freq: Three times a day (TID) | ORAL | Status: DC

## 2014-04-07 NOTE — ED Notes (Signed)
Pt c/o top right tooth ache.
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
## 2014-04-07 NOTE — Discharge Instructions (Signed)

## 2014-10-31 IMAGING — CR DG TOE GREAT 2+V*R*
3 series · 6 of 6 positions shown · non-contrast
Comparison: None.

CLINICAL DATA: Pain left first toe

RIGHT GREAT TOE

[Series 1001: view not recorded · 0.15mm/px · 2 of 2 slices shown (1 of 3)]
[im 1/2]
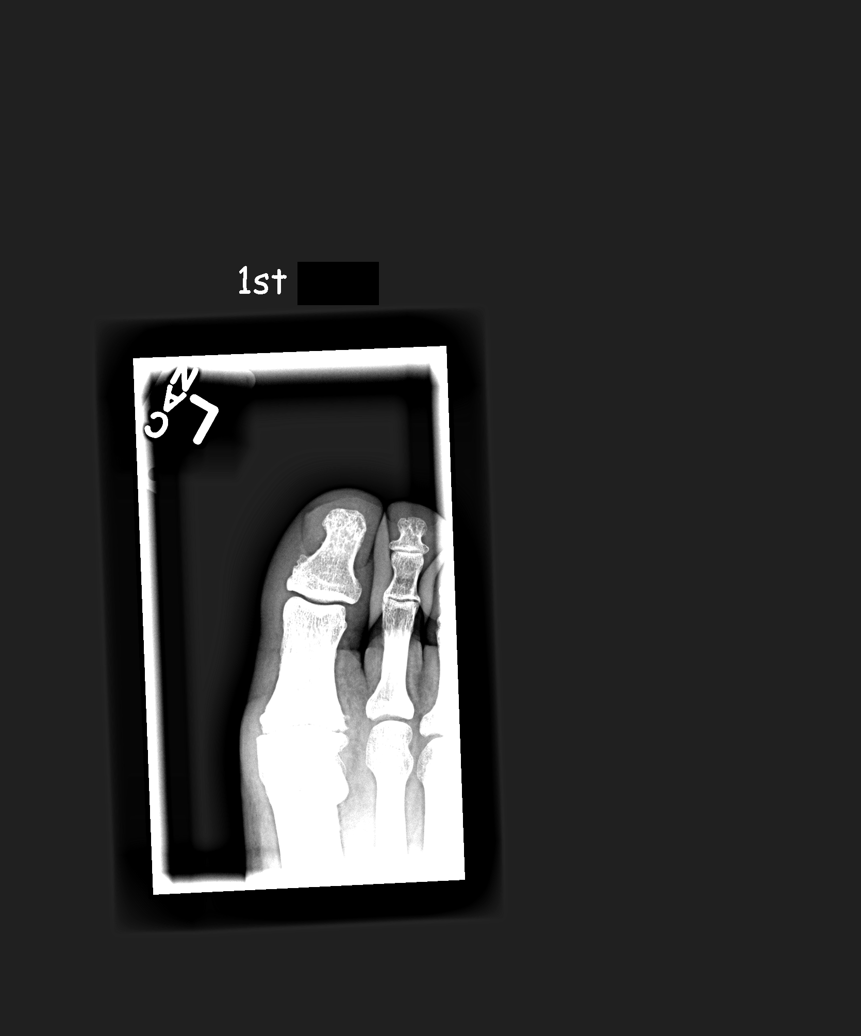
[im 2/2]
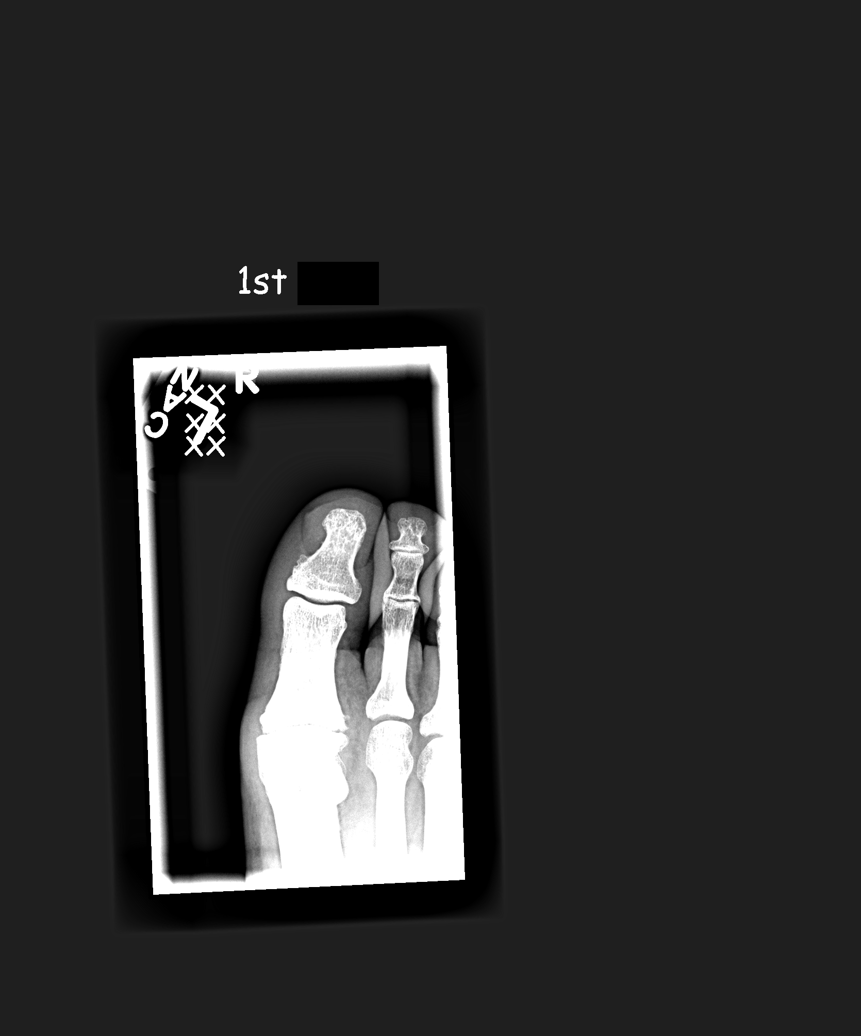

[Series 1002: view not recorded · 0.15mm/px · 2 of 2 slices shown (2 of 3)]
[im 1/2]
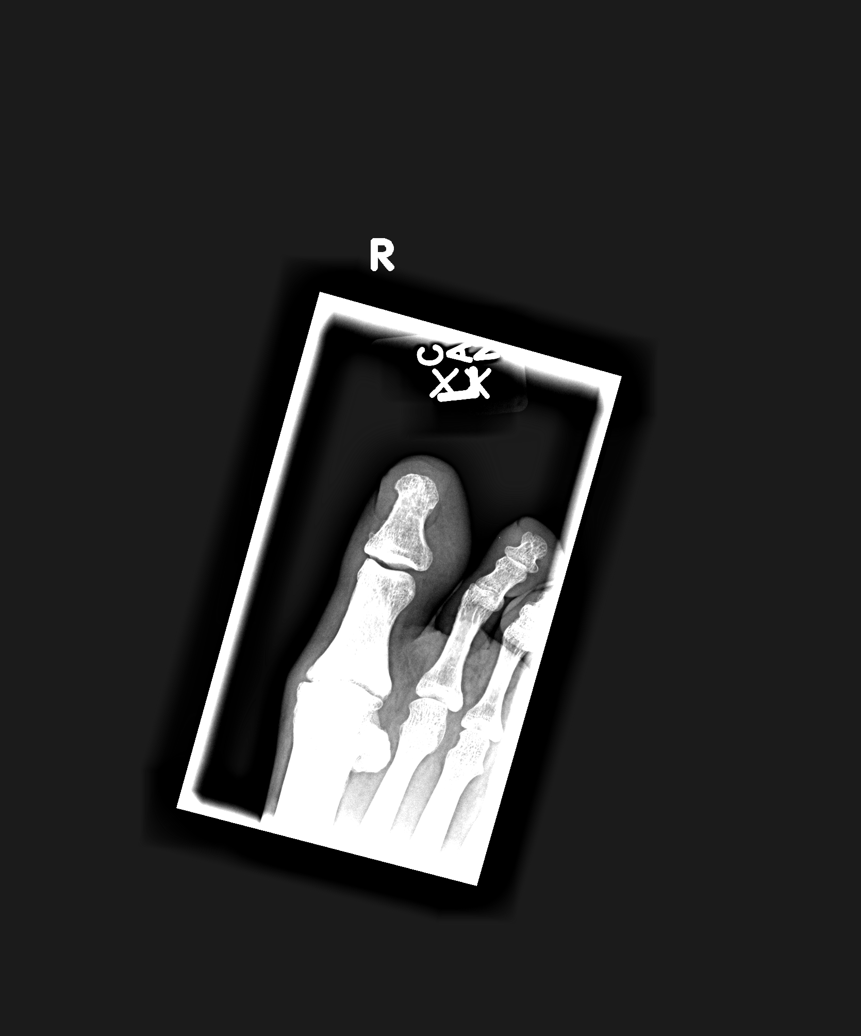
[im 2/2]
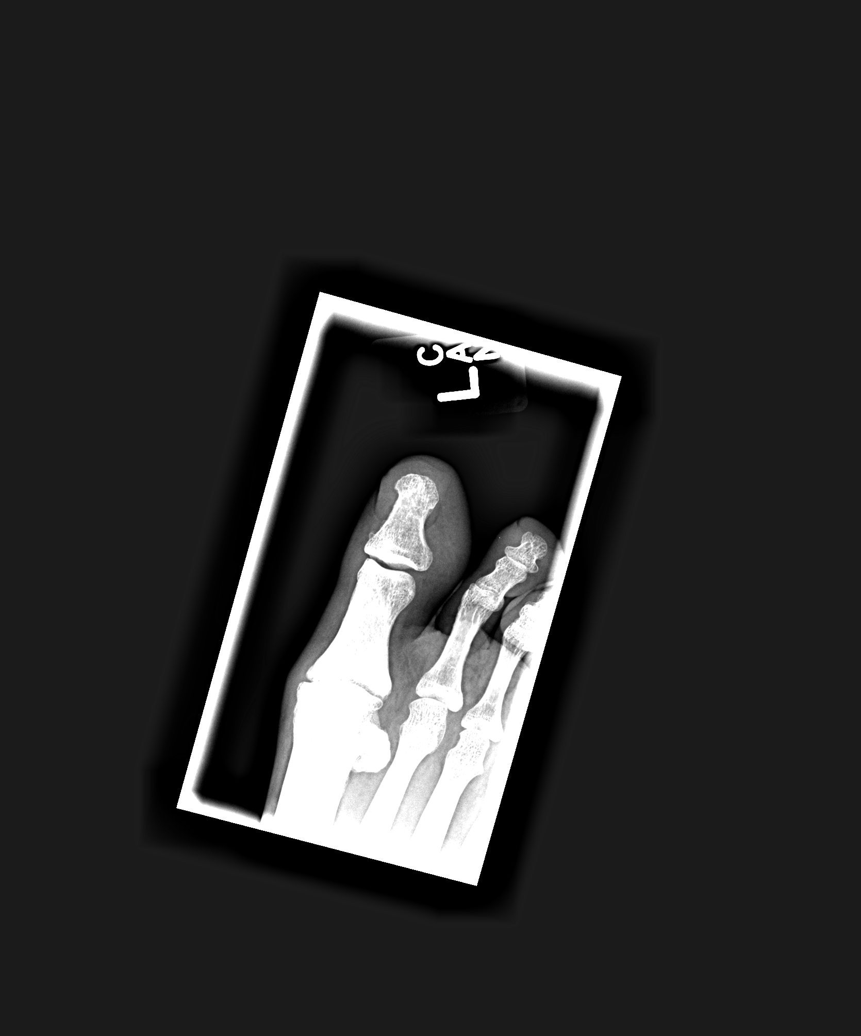

[Series 1003: view not recorded · 0.15mm/px · 2 of 2 slices shown (3 of 3)]
[im 1/2]
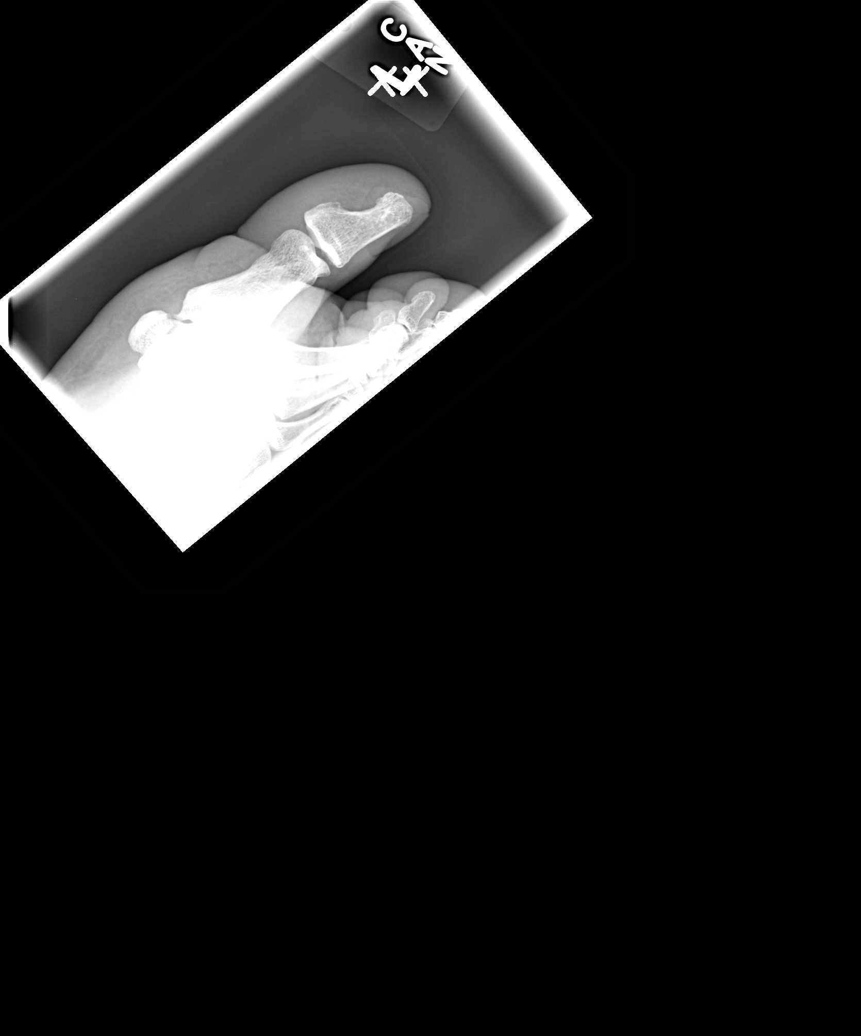
[im 2/2]
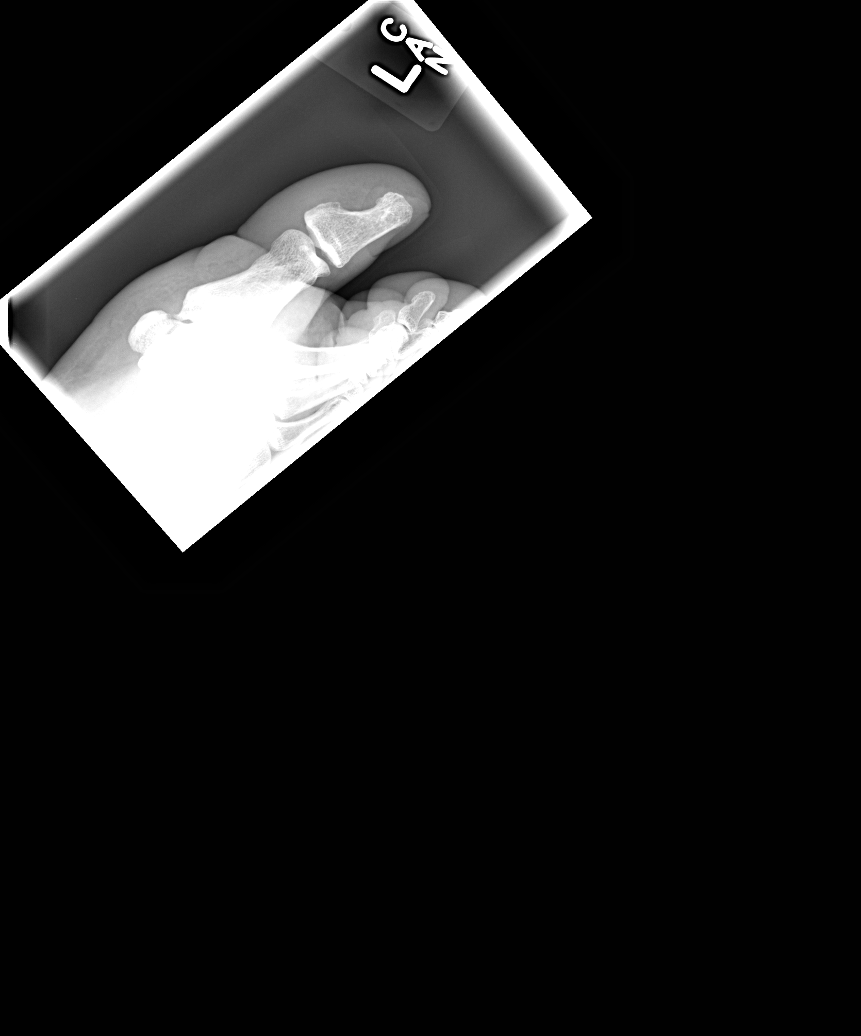

[6 of 6 positions shown; findings below may reference images not displayed]

FINDINGS: There is mild to moderate arthritis of the first
metatarsal phalangeal joint.  There is no fracture or dislocation.
IMPRESSION: No acute findings

## 2015-01-21 ENCOUNTER — Emergency Department (HOSPITAL_COMMUNITY)
Admission: EM | Admit: 2015-01-21 | Discharge: 2015-01-21 | Disposition: A | Discharge status: Home / Self Care | Payer: Self-pay | Attending: Emergency Medicine | Admitting: Emergency Medicine

## 2015-01-21 ENCOUNTER — Encounter (HOSPITAL_COMMUNITY): Payer: Self-pay

## 2015-01-21 DIAGNOSIS — Z8739 Personal history of other diseases of the musculoskeletal system and connective tissue: Secondary | ICD-10-CM | POA: Insufficient documentation

## 2015-01-21 DIAGNOSIS — Z720 Tobacco use: Secondary | ICD-10-CM | POA: Insufficient documentation

## 2015-01-21 DIAGNOSIS — F1910 Other psychoactive substance abuse, uncomplicated: Secondary | ICD-10-CM

## 2015-01-21 DIAGNOSIS — F1410 Cocaine abuse, uncomplicated: Secondary | ICD-10-CM | POA: Insufficient documentation

## 2015-01-21 DIAGNOSIS — Z87828 Personal history of other (healed) physical injury and trauma: Secondary | ICD-10-CM | POA: Insufficient documentation

## 2015-01-21 DIAGNOSIS — F1210 Cannabis abuse, uncomplicated: Secondary | ICD-10-CM | POA: Insufficient documentation

## 2015-01-21 DIAGNOSIS — Z791 Long term (current) use of non-steroidal anti-inflammatories (NSAID): Secondary | ICD-10-CM | POA: Insufficient documentation

## 2015-01-21 DIAGNOSIS — F1010 Alcohol abuse, uncomplicated: Secondary | ICD-10-CM | POA: Insufficient documentation

## 2015-01-21 DIAGNOSIS — Z8639 Personal history of other endocrine, nutritional and metabolic disease: Secondary | ICD-10-CM | POA: Insufficient documentation

## 2015-01-21 LAB — RAPID URINE DRUG SCREEN, HOSP PERFORMED
AMPHETAMINES: NOT DETECTED
BARBITURATES: NOT DETECTED
Benzodiazepines: NOT DETECTED
Cocaine: POSITIVE — AB
Opiates: NOT DETECTED
Tetrahydrocannabinol: POSITIVE — AB

## 2015-01-21 LAB — CBC
HCT: 37.1 % — ABNORMAL LOW (ref 39.0–52.0)
HEMOGLOBIN: 12.7 g/dL — AB (ref 13.0–17.0)
MCH: 24.6 pg — AB (ref 26.0–34.0)
MCHC: 34.2 g/dL (ref 30.0–36.0)
MCV: 71.8 fL — AB (ref 78.0–100.0)
Platelets: 237 10*3/uL (ref 150–400)
RBC: 5.17 MIL/uL (ref 4.22–5.81)
RDW: 14.7 % (ref 11.5–15.5)
WBC: 5.4 10*3/uL (ref 4.0–10.5)

## 2015-01-21 LAB — COMPREHENSIVE METABOLIC PANEL
ALK PHOS: 54 U/L (ref 39–117)
ALT: 23 U/L (ref 0–53)
ANION GAP: 9 (ref 5–15)
AST: 21 U/L (ref 0–37)
Albumin: 3.7 g/dL (ref 3.5–5.2)
BILIRUBIN TOTAL: 0.6 mg/dL (ref 0.3–1.2)
BUN: 18 mg/dL (ref 6–23)
CO2: 25 mmol/L (ref 19–32)
Calcium: 8.8 mg/dL (ref 8.4–10.5)
Chloride: 105 mmol/L (ref 96–112)
Creatinine, Ser: 1.13 mg/dL (ref 0.50–1.35)
GFR, EST AFRICAN AMERICAN: 88 mL/min — AB (ref >90)
GFR, EST NON AFRICAN AMERICAN: 76 mL/min — AB (ref >90)
GLUCOSE: 129 mg/dL — AB (ref 70–99)
Potassium: 3.4 mmol/L — ABNORMAL LOW (ref 3.5–5.1)
SODIUM: 139 mmol/L (ref 135–145)
TOTAL PROTEIN: 6.7 g/dL (ref 6.0–8.3)

## 2015-01-21 LAB — ETHANOL

## 2015-01-21 LAB — ACETAMINOPHEN LEVEL: Acetaminophen (Tylenol), Serum: <10 ug/mL — ABNORMAL LOW (ref 10–30)

## 2015-01-21 LAB — SALICYLATE LEVEL

## 2015-01-21 MED ORDER — CLONIDINE HCL 0.1 MG PO TABS: ORAL_TABLET | ORAL | Status: DC

## 2015-01-21 MED ORDER — ONDANSETRON 4 MG PO TBDP: ORAL_TABLET | ORAL | Status: DC

## 2015-01-21 NOTE — ED Notes (Addendum)
Pt. Has been doing cocaine and alcohol for 15 years.  Sinced Monday he has been on a binge and has not slept.  He is requesting help for deox from alcohol and cocaine  Pt. Is very tearful . Pt..'s last drink was last night .

## 2015-01-21 NOTE — Discharge Instructions (Signed)
If you were given medicines take as directed.  If you are on coumadin or contraceptives realize their levels and effectiveness is altered by many different medicines.  If you have any reaction (rash, tongues swelling, other) to the medicines stop taking and see a physician.   Please follow up as directed and return to the ER or see a physician for new or worsening symptoms.  Thank you. Filed Vitals:   01/21/15 1922  BP: 128/80  Pulse: 77  Temp: 98.5 F (36.9 C)  Resp: 18  SpO2: 95%    Emergency Department Resource Guide 1) Find a Doctor and Pay Out of Pocket Although you won't have to find out who is covered by your insurance plan, it is a good idea to ask around and get recommendations. You will then need to call the office and see if the doctor you have chosen will accept you as a new patient and what types of options they offer for patients who are self-pay. Some doctors offer discounts or will set up payment plans for their patients who do not have insurance, but you will need to ask so you aren't surprised when you get to your appointment.  2) Contact Your Local Health Department Not all health departments have doctors that can see patients for sick visits, but many do, so it is worth a call to see if yours does. If you don't know where your local health department is, you can check in your phone book. The CDC also has a tool to help you locate your state's health department, and many state websites also have listings of all of their local health departments.  3) Find a [facility] If your illness is not likely to be very severe or complicated, you may want to try a walk in clinic. These are popping up all over the country in pharmacies, drugstores, and shopping centers. They're usually staffed by nurse practitioners or physician assistants that have been trained to treat common illnesses and complaints. They're usually fairly quick and inexpensive. However, if you have serious medical  issues or chronic medical problems, these are probably not your best option.  No Primary Care Doctor: - Call Health Connect at  [phone] - they can help you locate a primary care doctor that  accepts your insurance, provides certain services, etc. - Physician Referral Service- [phone]  Chronic Pain Problems: Organization         Address  Phone   Notes  [facility]  [phone] Patients need to be referred by their primary care doctor.   Medication Assistance: Organization         Address  Phone   Notes  [facility] Medication [facility] [address] [place], [place] [address] [phone] --Must be a resident of [facility] -- Must have NO insurance coverage whatsoever (no Medicaid/ Medicare, etc.) -- The pt. MUST have a primary care doctor that directs their care regularly and follows them in the community   MedAssist  [phone]   [employer]  [phone]    Agencies that provide inexpensive medical care: Organization         Address  Phone   Notes  [facility]  [phone]   [facility]    [phone]   [facility] [place], [place] [address] [phone]   [place] [address] [place] [phone]  Planned Parenthood    [phone]   [facility]    [phone]   Community Health and [place] [address], [place] Phone:  [phone], Fax:  [phone] Hours of Operation:  9 am - 6 pm, M-F.  Also accepts Medicaid/Medicare and self-pay.  [facility] for [place] [address], [address], [place] Phone: [phone], Fax: [phone]. Hours of Operation:  8:30 am - 5:30 pm, M-F.  Also accepts Medicaid and self-pay.  [facility] [place] [address], [place] Phone: [phone]   [place], [place], [place]  [phone], Ext. [phone] Mondays & Thursdays: 7-9 AM.  First 15 patients are seen on a first come, first serve basis.    [place] Providers:  Organization         Address  Phone   Notes  [facility] [address], [place] [phone] Also accepts self-pay patients.  [facility] [address], [place]  [phone]   [place], [address], [place] [phone]   [facility] [address], [place] [phone]   Lucianne Lei [address], [place]   [phone] Only accepts [place] Access [place] patients after they have their name applied to their card.   Self-Pay (no insurance) in [facility]:  Organization         Address  Phone   Notes  Sickle Cell Patients, [facility] [place] [phone]   [facility] Care [place] [phone]   [facility] [place]  [place], [place], [place] [phone]   [facility]/Dr. Osei-Bonsu  [address] [place] or [address], [place] [phone] Phone number for both [place] and [place] locations is the same.  [facility]gent [facility] and [facility] [address] [place] [phone]   [facility] [address] [place] or [address] [phone]   [facility] [address] [place] [phone], phone; [phone], fax Sees patients 1st and 3rd Saturday of every month.  Must not qualify for public or private insurance (i.e. Medicaid, Medicare, [place] Health Choice, Veterans' Benefits)  Household income should be no more than 200% of the poverty level The clinic cannot treat you if you are pregnant or think you are pregnant  Sexually transmitted  diseases are not treated at the clinic.    Dental Care: Organization         Address  Phone  Notes  [facility] of [facility] [place] [phone] Accepts children up to age 25 who are enrolled in [place] or [place]; pregnant women with a Medicaid card; and children who have applied for Medicaid or [place] Health Choice, but were declined, whose parents can pay a reduced fee at time of service.  [facility] Department of [facility]  [address], [place] [phone] Accepts children up to age 96 who are enrolled in [place] or [place]; pregnant women with a Medicaid card; and children who have applied for Medicaid or [place], but were declined,  whose parents can pay a reduced fee at time of service.  [facility]  [place] [phone] Patients are seen by appointment only. Walk-ins are not accepted. [place] will see patients 2 years of age and older. Monday - Tuesday (8am-5pm) Most Wednesdays (8:30-5pm) $30 per visit, cash only  [facility] Adult Dental Access PROGRAM  [address], [facility] [phone] Patients are seen by appointment only. Walk-ins are not accepted. [place] will see patients 41 years of age and older. One Wednesday Evening (Monthly: Volunteer Based).  $30 per visit, cash only  [place]  [phone] for adults; Children under age 26, call Graduate Pediatric Dentistry at [phone]. Children aged 20-14, please call [phone] to request a pediatric application.  Dental services are provided in all areas of dental care including fillings, crowns and bridges, complete and partial dentures, implants, gum treatment, root canals, and extractions. Preventive care is also provided. Treatment is provided to both adults and children. Patients are selected via a  lottery and there is often a waiting list.   [facility] [address] [place]  [phone] [email or web address]   [facility] [address] [place], [place] [phone], Ext. [phone] Second and Fourth Thursday of each month, opens at 6:30 AM; Clinic ends at 9 AM.  Patients are seen on a first-come first-served basis, and a limited number are seen during each clinic.   [facility]  [address] Hillard Danker [place], [place] [phone]   Eligibility Requirements You must have lived in [place], [place], or [place] counties for at least the last three months.   You cannot be eligible for state or federal sponsored [employer], including [employer], [place], or [employer].   You generally cannot be eligible for healthcare insurance through your employer.    How to apply: Eligibility screenings are held every Tuesday and Wednesday afternoon from 1:00 pm until 4:00 pm. You do not need an appointment for the interview!  [facility] [address] [place], [place]   [place]  [place] Department  [place]  [phone]    Behavioral Health Resources in the Community: Intensive Outpatient Programs Organization         Address  Phone  Notes  [facility] [place]. [address] [place], [place] [phone]   [facility]patient [address] [place], [place]   ADS: Alcohol & Drug Svcs [address], [place], [place]   [place] [address]  [place], [place] or [phone]   Substance Abuse Resources Organization         Address  Phone  Notes  Alcohol and Drug Services  [phone]   [place]  [phone]   [facility] [place]   Chinita Pester  [phone]   Residential &  Outpatient Substance Abuse Program  [phone]   Psychological Services Organization         Address  Phone  Notes  [facility]  [place]  [phone]   [place] [address], [place] or [phone]    Mobile Crisis Teams Organization         Address  Phone  Notes  Therapeutic Alternatives, Mobile  Crisis Care Unit  [phone]   Assertive Psychotherapeutic Services  [address] [place], [place]   [facility] [address] [place] [phone]    Self-Help/Support Groups Organization         Address  Phone             Notes  [place]. of [place] - variety of support groups  [place] Call for more information  Narcotics Anonymous (NA), [facility]aring [facility] [address], [employer] [place]  2 meetings at this location   Special educational needs teacher         Address  Phone  Notes  ASAP Residential Treatment [place],    [place]  [phone]   [facility]  [address] [place] [address], [place], [place]   [place], [place] [phone] Admissions: 8am-3pm M-F  Incentives Substance [place] [address],    [place], [place] [phone]   [facility] [address] [place], [place], [place]   [facility] [address],  [place], [place]   [facility] Programs - Intensive Outpatient [address], [place], [place]   [facility] [place] ([place] [address]  [place], [place] [phone] or [phone]   [facility] Services (RTS) [address], [place], [place] Accepts Medicaid  [facility] [place] [address],  [place] [phone] Substance Abuse/Addiction Treatment   [facility] Organization          Address  Phone  Notes  [facility]  [phone]   Domenic Schwab, PhD [address] Arlis Porta [place], [place]   [phone] or [phone]   [place], [place] [phone]   [facility] [address] [place] [employer], [place] [phone] Insurance/Medicaid/sponsorship through [facility] and Families [address] [place]                                    [place], [place] [phone] [place] [address], [place] [phone]    Dr. Adele Schilder  [phone]   [facility] Dept. 1) [address], [place] 2) [place] 3)  [place] [address], [place] [phone]  [phone]   [place] [phone] or [phone] (After Hours)
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
## 2015-01-21 NOTE — ED Notes (Signed)
Pt. Denies any suicidal thoughts or plans.

## 2016-11-16 ENCOUNTER — Ambulatory Visit (HOSPITAL_COMMUNITY)
Admission: EM | Admit: 2016-11-16 | Discharge: 2016-11-16 | Disposition: A | Discharge status: Home / Self Care | Payer: Self-pay | Attending: Family Medicine | Admitting: Family Medicine

## 2016-11-16 ENCOUNTER — Encounter (HOSPITAL_COMMUNITY): Payer: Self-pay | Admitting: *Deleted

## 2016-11-16 DIAGNOSIS — R69 Illness, unspecified: Secondary | ICD-10-CM

## 2016-11-16 DIAGNOSIS — J111 Influenza due to unidentified influenza virus with other respiratory manifestations: Secondary | ICD-10-CM

## 2016-11-16 MED ORDER — GUAIFENESIN-CODEINE 100-10 MG/5ML PO SYRP: 10.0000 mL | ORAL_SOLUTION | Freq: Four times a day (QID) | ORAL | 0 refills | Status: DC | PRN

## 2016-11-16 MED ORDER — IPRATROPIUM BROMIDE 0.06 % NA SOLN: 2.0000 | Freq: Four times a day (QID) | NASAL | 1 refills | Status: DC

## 2016-11-16 NOTE — ED Triage Notes (Signed)
Symptoms   Of  Body  Aches     Chills   Aching       Headaches      With   Symptoms  X  5  Days      Pt  Is  Hoarse    At  Times        Pt  Ambulated  To  Room   With a  Steady  Fluid  Gait      He  Has  Been  Trying  otc  meds      Without   Much  releif
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
## 2016-11-27 ENCOUNTER — Ambulatory Visit (HOSPITAL_COMMUNITY)
Admission: EM | Admit: 2016-11-27 | Discharge: 2016-11-27 | Disposition: A | Discharge status: Home / Self Care | Payer: Self-pay | Attending: Family Medicine | Admitting: Family Medicine

## 2016-11-27 DIAGNOSIS — I951 Orthostatic hypotension: Secondary | ICD-10-CM

## 2016-11-27 DIAGNOSIS — E860 Dehydration: Secondary | ICD-10-CM

## 2016-11-27 DIAGNOSIS — M778 Other enthesopathies, not elsewhere classified: Secondary | ICD-10-CM

## 2016-11-27 LAB — POCT I-STAT, CHEM 8
BUN: 12 mg/dL (ref 6–20)
Calcium, Ion: 1.19 mmol/L (ref 1.15–1.40)
Chloride: 102 mmol/L (ref 101–111)
Creatinine, Ser: 1.1 mg/dL (ref 0.61–1.24)
Glucose, Bld: 119 mg/dL — ABNORMAL HIGH (ref 65–99)
HEMATOCRIT: 42 % (ref 39.0–52.0)
HEMOGLOBIN: 14.3 g/dL (ref 13.0–17.0)
POTASSIUM: 4 mmol/L (ref 3.5–5.1)
SODIUM: 139 mmol/L (ref 135–145)
TCO2: 27 mmol/L (ref 0–100)

## 2016-11-27 MED ORDER — MELOXICAM 15 MG PO TABS: 15.0000 mg | ORAL_TABLET | Freq: Every day | ORAL | 0 refills | Status: AC

## 2016-11-27 MED ORDER — SODIUM CHLORIDE 0.9 % IV BOLUS (SEPSIS)
1000.0000 mL | Freq: Once | INTRAVENOUS | Status: AC
  Administered 2016-11-27: 1000 mL via INTRAVENOUS
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
## 2016-11-27 NOTE — Discharge Instructions (Addendum)
You appear to be dehydrated, you had orthostatic hypotension when you arrived. You have been given 1 liter of normal saline IV, Blood work have been checked and your istat was normal with the excepting of your blood glucose being 119. I recommend you drink plenty of water as well as gatoraid. Avoid caffeine containing products as this will dehydrate you more.  For your wrist, I believe you have tendonitis. Your wrist has been splinted in clinic. I have sent a prescription to your pharmacy for Mobic, take 1 tablet daily. Should your pain continue I recommend you follow up with an orthopedic doctor for further evaluation

## 2016-11-27 NOTE — ED Notes (Signed)
[place], np notified post fluid orthostatics

## 2016-11-27 NOTE — ED Triage Notes (Signed)
C/o right wrist pain States something bite him States he feels nausea and weak
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
## 2017-03-14 ENCOUNTER — Encounter (HOSPITAL_COMMUNITY): Payer: Self-pay | Admitting: Emergency Medicine

## 2017-03-14 ENCOUNTER — Ambulatory Visit (HOSPITAL_COMMUNITY)
Admission: EM | Admit: 2017-03-14 | Discharge: 2017-03-14 | Disposition: A | Discharge status: Home / Self Care | Payer: Self-pay | Attending: Family Medicine | Admitting: Family Medicine

## 2017-03-14 DIAGNOSIS — L03011 Cellulitis of right finger: Secondary | ICD-10-CM

## 2017-03-14 MED ORDER — HYDROCODONE-ACETAMINOPHEN 5-325 MG PO TABS: 1.0000 | ORAL_TABLET | Freq: Four times a day (QID) | ORAL | 0 refills | Status: DC | PRN

## 2017-03-14 MED ORDER — LIDOCAINE HCL (PF) 1 % IJ SOLN
INTRAMUSCULAR | Status: AC
  Filled 2017-03-14: qty 2

## 2017-03-14 MED ORDER — DOXYCYCLINE HYCLATE 100 MG PO TABS: 100.0000 mg | ORAL_TABLET | Freq: Two times a day (BID) | ORAL | 0 refills | Status: DC

## 2017-03-14 NOTE — Discharge Instructions (Signed)
Soak the finger in warm soapy water three times a day.

## 2017-03-14 NOTE — ED Triage Notes (Signed)
Pt c/o right middle finger infection onset 5 days  Reports he cut his fingernails too close and since then, has developed swelling around nail bed associated w/pain  A&O x4... NAD... Ambulatory
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
## 2017-09-25 ENCOUNTER — Other Ambulatory Visit: Payer: Self-pay

## 2017-09-25 ENCOUNTER — Encounter (HOSPITAL_COMMUNITY): Payer: Self-pay | Admitting: *Deleted

## 2017-09-25 ENCOUNTER — Ambulatory Visit (HOSPITAL_COMMUNITY)
Admission: EM | Admit: 2017-09-25 | Discharge: 2017-09-25 | Disposition: A | Discharge status: Home / Self Care | Payer: Self-pay | Attending: Family Medicine | Admitting: Family Medicine

## 2017-09-25 DIAGNOSIS — M2550 Pain in unspecified joint: Secondary | ICD-10-CM

## 2017-09-25 MED ORDER — AMITRIPTYLINE HCL 25 MG PO TABS
25.0000 mg | ORAL_TABLET | Freq: Every day | ORAL | 1 refills | Status: DC
Start: 2017-09-25 — End: 2018-05-26

## 2017-09-25 MED ORDER — MELOXICAM 15 MG PO TABS: 15.0000 mg | ORAL_TABLET | Freq: Every day | ORAL | 1 refills | Status: DC
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
## 2017-09-25 NOTE — ED Triage Notes (Addendum)
Pain in both hands, feet and knees, pain in both feet and shoulders, per pt he has gout, per pt did not take meds, per pt he thinks his gout is acting up

## 2018-01-02 ENCOUNTER — Encounter (HOSPITAL_COMMUNITY): Payer: Self-pay | Admitting: Emergency Medicine

## 2018-01-02 ENCOUNTER — Ambulatory Visit (HOSPITAL_COMMUNITY)
Admission: EM | Admit: 2018-01-02 | Discharge: 2018-01-02 | Disposition: A | Discharge status: Home / Self Care | Payer: Self-pay | Attending: Internal Medicine | Admitting: Internal Medicine

## 2018-01-02 DIAGNOSIS — M109 Gout, unspecified: Secondary | ICD-10-CM | POA: Insufficient documentation

## 2018-01-02 DIAGNOSIS — Z79899 Other long term (current) drug therapy: Secondary | ICD-10-CM | POA: Insufficient documentation

## 2018-01-02 DIAGNOSIS — R300 Dysuria: Secondary | ICD-10-CM | POA: Insufficient documentation

## 2018-01-02 DIAGNOSIS — Z202 Contact with and (suspected) exposure to infections with a predominantly sexual mode of transmission: Secondary | ICD-10-CM | POA: Insufficient documentation

## 2018-01-02 DIAGNOSIS — Z711 Person with feared health complaint in whom no diagnosis is made: Secondary | ICD-10-CM

## 2018-01-02 DIAGNOSIS — F17210 Nicotine dependence, cigarettes, uncomplicated: Secondary | ICD-10-CM | POA: Insufficient documentation

## 2018-01-02 DIAGNOSIS — E7800 Pure hypercholesterolemia, unspecified: Secondary | ICD-10-CM | POA: Insufficient documentation

## 2018-01-02 LAB — POCT URINALYSIS DIP (DEVICE)
BILIRUBIN URINE: NEGATIVE
Glucose, UA: NEGATIVE mg/dL
Ketones, ur: NEGATIVE mg/dL
NITRITE: NEGATIVE
Protein, ur: NEGATIVE mg/dL
Specific Gravity, Urine: 1.02 (ref 1.005–1.030)
Urobilinogen, UA: 1 mg/dL (ref 0.0–1.0)
pH: 6.5 (ref 5.0–8.0)

## 2018-01-02 MED ORDER — AZITHROMYCIN 250 MG PO TABS
ORAL_TABLET | ORAL | Status: AC
  Filled 2018-01-02: qty 4

## 2018-01-02 MED ORDER — LIDOCAINE HCL (PF) 1 % IJ SOLN
INTRAMUSCULAR | Status: AC
  Filled 2018-01-02: qty 2

## 2018-01-02 MED ORDER — CEFTRIAXONE SODIUM 250 MG IJ SOLR
INTRAMUSCULAR | Status: AC
  Filled 2018-01-02: qty 250

## 2018-01-02 MED ORDER — AZITHROMYCIN 250 MG PO TABS
1000.0000 mg | ORAL_TABLET | Freq: Once | ORAL | Status: AC
  Administered 2018-01-02: 1000 mg via ORAL

## 2018-01-02 MED ORDER — CEFTRIAXONE SODIUM 250 MG IJ SOLR
250.0000 mg | Freq: Once | INTRAMUSCULAR | Status: AC
  Administered 2018-01-02: 250 mg via INTRAMUSCULAR
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
## 2018-01-02 NOTE — Discharge Instructions (Signed)
We have treated you today for gonorrhea and chlamydia. We are testing you for these as well as trichomonas.  I have also sent a test for herpes as well as culture for urinary tract infection. Will notify you of any positive findings and if any changes to treatment are needed.   Please withhold from intercourse for the next week. Please use condoms to prevent STD's.

## 2018-01-02 NOTE — ED Triage Notes (Signed)
PT reports dysuria that started yesterday.

## 2018-01-02 NOTE — ED Notes (Signed)
At bedside for natalie, np examination of patient and specimen collection x 1

## 2018-01-03 LAB — URINE CYTOLOGY ANCILLARY ONLY
CHLAMYDIA, DNA PROBE: NEGATIVE
NEISSERIA GONORRHEA: POSITIVE — AB
Trichomonas: NEGATIVE

## 2018-01-04 LAB — URINE CULTURE: Culture: NO GROWTH

## 2018-01-05 LAB — HSV CULTURE AND TYPING

## 2018-01-06 ENCOUNTER — Telehealth (HOSPITAL_COMMUNITY): Payer: Self-pay | Admitting: *Deleted

## 2018-01-06 LAB — URINE CYTOLOGY ANCILLARY ONLY
BACTERIAL VAGINITIS: NEGATIVE
CANDIDA VAGINITIS: NEGATIVE

## 2018-01-06 NOTE — Telephone Encounter (Signed)
Pt called regarding test results from previous visit, not available at this time. Family member verbalized need for patient to call back.

## 2018-01-06 NOTE — Telephone Encounter (Signed)
Pt returned call regarding test results.  Pt verbalized understanding. Education on safe sex practices given to pt.  Health department notified of results.

## 2018-01-27 ENCOUNTER — Encounter: Payer: Self-pay | Admitting: Emergency Medicine

## 2018-01-27 ENCOUNTER — Ambulatory Visit (INDEPENDENT_AMBULATORY_CARE_PROVIDER_SITE_OTHER): Payer: No Typology Code available for payment source | Admitting: Emergency Medicine

## 2018-01-27 ENCOUNTER — Other Ambulatory Visit: Payer: Self-pay

## 2018-01-27 VITALS — BP 106/76 | HR 58 | Temp 98.6°F | Resp 16 | Ht 68.0 in | Wt 201.2 lb

## 2018-01-27 DIAGNOSIS — M67441 Ganglion, right hand: Secondary | ICD-10-CM | POA: Insufficient documentation

## 2018-01-27 DIAGNOSIS — M6740 Ganglion, unspecified site: Secondary | ICD-10-CM
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
## 2018-01-27 NOTE — Patient Instructions (Addendum)
   IF you received an x-ray today, you will receive an invoice from [facility]. Please contact [facility] at [phone] with questions or concerns regarding your invoice.   IF you received labwork today, you will receive an invoice from [facility]. Please contact [facility] at [phone] with questions or concerns regarding your invoice.   Our billing staff will not be able to assist you with questions regarding bills from these companies.  You will be contacted with the lab results as soon as they are available. The fastest way to get your results is to activate your My Chart account. Instructions are located on the last page of this paperwork. If you have not heard from us regarding the results in 2 weeks, please contact this office.     Ganglion Cyst A ganglion cyst is a noncancerous, fluid-filled lump that occurs near joints or tendons. The ganglion cyst grows out of a joint or the lining of a tendon. It most often develops in the hand or wrist, but it can also develop in the shoulder, elbow, hip, knee, ankle, or foot. The round or oval ganglion cyst can be the size of a pea or larger than a grape. Increased activity may enlarge the size of the cyst because more fluid starts to build up. What are the causes? It is not known what causes a ganglion cyst to grow. However, it may be related to:  Inflammation or irritation around the joint.  An injury.  Repetitive movements or overuse.  Arthritis.  What increases the risk? Risk factors include:  Being a woman.  Being age 20-50.  What are the signs or symptoms? Symptoms may include:  A lump. This most often appears on the hand or wrist, but it can occur in other areas of the body.  Tingling.  Pain.  Numbness.  Muscle weakness.  Weak grip.  Less movement in a joint.  How is this diagnosed? Ganglion cysts are most often diagnosed based on a physical exam. Your health care provider will feel the  lump and may shine a light alongside it. If it is a ganglion cyst, a light often shines through it. Your health care provider may order an X-ray, ultrasound, or MRI to rule out other conditions. How is this treated? Ganglion cysts usually go away on their own without treatment. If pain or other symptoms are involved, treatment may be needed. Treatment is also needed if the ganglion cyst limits your movement or if it gets infected. Treatment may include:  Wearing a brace or splint on your wrist or finger.  Taking anti-inflammatory medicine.  Draining fluid from the lump with a needle (aspiration).  Injecting a steroid into the joint.  Surgery to remove the ganglion cyst.  Follow these instructions at home:  Do not press on the ganglion cyst, poke it with a needle, or hit it.  Take medicines only as directed by your health care provider.  Wear your brace or splint as directed by your health care provider.  Watch your ganglion cyst for any changes.  Keep all follow-up visits as directed by your health care provider. This is important. Contact a health care provider if:  Your ganglion cyst becomes larger or more painful.  You have increased redness, red streaks, or swelling.  You have pus coming from the lump.  You have weakness or numbness in the affected area.  You have a fever or chills. This information is not intended to replace advice given to you by your   health care provider. Make sure you discuss any questions you have with your health care provider. Document Released: 10/05/2000 Document Revised: 03/15/2016 Document Reviewed: 03/23/2014 Elsevier Interactive Patient Education  2018 [employer].

## 2018-05-26 ENCOUNTER — Encounter (HOSPITAL_COMMUNITY): Payer: Self-pay

## 2018-05-26 ENCOUNTER — Ambulatory Visit (HOSPITAL_COMMUNITY)
Admission: EM | Admit: 2018-05-26 | Discharge: 2018-05-26 | Disposition: A | Discharge status: Home / Self Care | Payer: No Typology Code available for payment source | Attending: Urgent Care | Admitting: Urgent Care

## 2018-05-26 DIAGNOSIS — M62830 Muscle spasm of back: Secondary | ICD-10-CM | POA: Diagnosis not present

## 2018-05-26 DIAGNOSIS — M898X1 Other specified disorders of bone, shoulder: Secondary | ICD-10-CM

## 2018-05-26 DIAGNOSIS — M549 Dorsalgia, unspecified: Secondary | ICD-10-CM

## 2018-05-26 MED ORDER — CYCLOBENZAPRINE HCL 5 MG PO TABS: 5.0000 mg | ORAL_TABLET | Freq: Three times a day (TID) | ORAL | 0 refills | Status: DC | PRN

## 2018-05-26 MED ORDER — MELOXICAM 15 MG PO TABS: 7.5000 mg | ORAL_TABLET | Freq: Every day | ORAL | 0 refills | Status: DC

## 2018-05-26 NOTE — ED Triage Notes (Signed)
Pt presents with back pain in the left shoulder area

## 2018-05-26 NOTE — Discharge Instructions (Addendum)
Hydrate well with at least 2 liters (1 gallon) of water daily. If Flexeril makes you sleepy then take it at bedtime only.
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
## 2018-11-10 ENCOUNTER — Other Ambulatory Visit: Payer: Self-pay | Admitting: Occupational Medicine

## 2018-11-10 ENCOUNTER — Ambulatory Visit: Payer: Self-pay

## 2018-11-10 DIAGNOSIS — M79672 Pain in left foot: Secondary | ICD-10-CM

## 2019-04-28 ENCOUNTER — Ambulatory Visit: Payer: No Typology Code available for payment source | Attending: Family Medicine | Admitting: Family Medicine

## 2019-04-28 ENCOUNTER — Encounter: Payer: Self-pay | Admitting: Family Medicine

## 2019-04-28 ENCOUNTER — Other Ambulatory Visit: Payer: Self-pay

## 2019-04-28 VITALS — BP 97/60 | HR 61 | Temp 98.4°F | Ht 69.0 in | Wt 188.0 lb

## 2019-04-28 DIAGNOSIS — Z1159 Encounter for screening for other viral diseases: Secondary | ICD-10-CM

## 2019-04-28 DIAGNOSIS — E7800 Pure hypercholesterolemia, unspecified: Secondary | ICD-10-CM | POA: Diagnosis not present

## 2019-04-28 DIAGNOSIS — Z1211 Encounter for screening for malignant neoplasm of colon: Secondary | ICD-10-CM
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
## 2019-04-28 NOTE — Patient Instructions (Signed)

## 2019-04-28 NOTE — Progress Notes (Signed)
Patient has no concerns today just needs to est care.

## 2019-05-01 ENCOUNTER — Other Ambulatory Visit: Payer: Self-pay

## 2019-05-01 ENCOUNTER — Ambulatory Visit: Payer: No Typology Code available for payment source | Attending: Family Medicine

## 2019-05-01 DIAGNOSIS — E7800 Pure hypercholesterolemia, unspecified: Secondary | ICD-10-CM

## 2019-05-01 DIAGNOSIS — Z1159 Encounter for screening for other viral diseases: Secondary | ICD-10-CM

## 2019-05-02 LAB — CMP14+EGFR
ALT: 20 IU/L (ref 0–44)
AST: 25 IU/L (ref 0–40)
Albumin/Globulin Ratio: 1.7 (ref 1.2–2.2)
Albumin: 4.1 g/dL (ref 3.8–4.9)
Alkaline Phosphatase: 47 IU/L (ref 39–117)
BUN/Creatinine Ratio: 15 (ref 9–20)
BUN: 13 mg/dL (ref 6–24)
Bilirubin Total: 0.4 mg/dL (ref 0.0–1.2)
CO2: 20 mmol/L (ref 20–29)
Calcium: 8.8 mg/dL (ref 8.7–10.2)
Chloride: 103 mmol/L (ref 96–106)
Creatinine, Ser: 0.87 mg/dL (ref 0.76–1.27)
GFR calc Af Amer: 116 mL/min/{1.73_m2} (ref >59)
GFR calc non Af Amer: 100 mL/min/{1.73_m2} (ref >59)
Globulin, Total: 2.4 g/dL (ref 1.5–4.5)
Glucose: 79 mg/dL (ref 65–99)
Potassium: 3.9 mmol/L (ref 3.5–5.2)
Sodium: 140 mmol/L (ref 134–144)
Total Protein: 6.5 g/dL (ref 6.0–8.5)

## 2019-05-02 LAB — LIPID PANEL
Chol/HDL Ratio: 2.8 ratio (ref 0.0–5.0)
Cholesterol, Total: 189 mg/dL (ref 100–199)
HDL: 68 mg/dL (ref >39)
LDL Calculated: 116 mg/dL — ABNORMAL HIGH (ref 0–99)
Triglycerides: 27 mg/dL (ref 0–149)
VLDL Cholesterol Cal: 5 mg/dL (ref 5–40)

## 2019-05-02 LAB — HIV ANTIBODY (ROUTINE TESTING W REFLEX): HIV Screen 4th Generation wRfx: NONREACTIVE

## 2019-05-08 ENCOUNTER — Telehealth: Payer: Self-pay

## 2019-05-08 NOTE — Telephone Encounter (Signed)
Patient was called and a voicemail was left informing patient to return phone call for lab results. 

## 2019-05-08 NOTE — Telephone Encounter (Signed)
Patient name and DOB has been verified Patient was informed of lab results. Patient had no questions.  

## 2019-05-08 NOTE — Telephone Encounter (Signed)
-----   Message from Charlott Rakes, MD sent at 05/04/2019  1:16 PM EDT ----- Slightly elevated cholesterol however due to his low cardiovascular risk medication is not indicated at this time.  Please advise to work on low-cholesterol diet and exercise.

## 2019-05-14 ENCOUNTER — Other Ambulatory Visit: Payer: Self-pay

## 2019-05-14 ENCOUNTER — Ambulatory Visit: Payer: No Typology Code available for payment source

## 2019-05-14 VITALS — Ht 69.0 in | Wt 180.0 lb

## 2019-05-14 DIAGNOSIS — Z1211 Encounter for screening for malignant neoplasm of colon: Secondary | ICD-10-CM

## 2019-05-14 MED ORDER — SUPREP BOWEL PREP KIT 17.5-3.13-1.6 GM/177ML PO SOLN: 1.0000 | Freq: Once | ORAL | 0 refills | Status: AC

## 2019-05-14 NOTE — Progress Notes (Signed)
Per pt, no allergies to soy or egg products.Pt not taking any weight loss meds or using  O2 at home.  Pt denies sedation problems. Pt refused emmi video.  The PV was done over the phone due to COVID-19. Verified pt's address and insurance. Reviewed medical hx and prep instructions and will mail paperwork to pt. Informed pt to call with any questions or changes prior to his procedure. Pt understood

## 2019-05-28 ENCOUNTER — Telehealth: Payer: Self-pay | Admitting: Gastroenterology

## 2019-05-28 NOTE — Telephone Encounter (Signed)

## 2019-05-29 ENCOUNTER — Other Ambulatory Visit: Payer: Self-pay

## 2019-05-29 ENCOUNTER — Encounter: Payer: Self-pay | Admitting: Gastroenterology

## 2019-05-29 ENCOUNTER — Ambulatory Visit (AMBULATORY_SURGERY_CENTER): Payer: No Typology Code available for payment source | Admitting: Gastroenterology

## 2019-05-29 VITALS — BP 150/62 | HR 48 | Temp 98.4°F | Resp 16 | Ht 69.0 in | Wt 180.0 lb

## 2019-05-29 DIAGNOSIS — Z1211 Encounter for screening for malignant neoplasm of colon: Secondary | ICD-10-CM | POA: Diagnosis present

## 2019-05-29 DIAGNOSIS — K635 Polyp of colon: Secondary | ICD-10-CM | POA: Diagnosis not present

## 2019-05-29 DIAGNOSIS — D122 Benign neoplasm of ascending colon: Secondary | ICD-10-CM

## 2019-05-29 MED ORDER — SODIUM CHLORIDE 0.9 % IV SOLN: 500.0000 mL | Freq: Once | INTRAVENOUS | Status: DC

## 2019-05-29 NOTE — Progress Notes (Signed)
Called to room to assist during endoscopic procedure.  Patient ID and intended procedure confirmed with present staff. Received instructions for my participation in the procedure from the performing physician.  

## 2019-05-29 NOTE — Op Note (Signed)
[place] Patient Name: Trevor Bean Procedure Date: 05/29/2019 1:38 PM MRN: [id number] Endoscopist: Thornton Park MD, MD Age: 53 Referring MD:  Date of Birth: 01-22-1966 Gender: Male Account #: [id number] Procedure:                Colonoscopy Indications:              Screening for colorectal malignant neoplasm, This                            is the patient's first colonoscopy                           No known family history of colon cancer or polyps                           No baseline GI symptoms Medicines:                See the Anesthesia note for documentation of the                            administered medications Procedure:                Pre-Anesthesia Assessment:                           - Prior to the procedure, a History and Physical                            was performed, and patient medications and                            allergies were reviewed. The patient's tolerance of                            previous anesthesia was also reviewed. The risks                            and benefits of the procedure and the sedation                            options and risks were discussed with the patient.                            All questions were answered, and informed consent                            was obtained. Prior Anticoagulants: The patient has                            taken no previous anticoagulant or antiplatelet                            agents. ASA Grade Assessment: II - A patient with  mild systemic disease. After reviewing the risks                            and benefits, the patient was deemed in                            satisfactory condition to undergo the procedure.                           After obtaining informed consent, the colonoscope                            was passed under direct vision. Throughout the                            procedure, the patient's blood pressure, pulse, and                         oxygen saturations were monitored continuously. The                            Colonoscope was introduced through the anus and                            advanced to the the terminal ileum, with                            identification of the appendiceal orifice and IC                            valve. A second forward view of the right colon was                            performed. The colonoscopy was performed without                            difficulty. The patient tolerated the procedure                            well. The quality of the bowel preparation was                            adequate. The terminal ileum, ileocecal valve,                            appendiceal orifice, and rectum were photographed. Scope In: 1:53:38 PM Scope Out: 2:10:47 PM Scope Withdrawal Time: 0 hours 13 minutes 58 seconds  Total Procedure Duration: 0 hours 17 minutes 9 seconds  Findings:                 The perianal and digital rectal examinations were                            normal.  A 2 mm polyp was found in the ascending colon. The                            polyp was sessile. The polyp was removed with a                            cold snare. Resection and retrieval were complete.                            Estimated blood loss was minimal.                           The exam was otherwise without abnormality on                            direct and retroflexion views. Complications:            No immediate complications. Estimated blood loss:                            Minimal. Estimated Blood Loss:     Estimated blood loss was minimal. Impression:               - One 2 mm polyp in the ascending colon, removed                            with a cold snare. Resected and retrieved.                           - The examination was otherwise normal on direct                            and retroflexion views. Recommendation:           - Patient has a  contact number available for                            emergencies. The signs and symptoms of potential                            delayed complications were discussed with the                            patient. Return to normal activities tomorrow.                            Written discharge instructions were provided to the                            patient.                           - Resume previous diet today.                           - Continue present  medications.                           - Await pathology results.                           - Repeat colonoscopy in 7 years for surveillance if                            the polyp is an adenoma. If the polyp is benign,                            continue with routine colon cancer screening. Thornton Park MD, MD 05/29/2019 2:17:59 PM This report has been signed electronically.

## 2019-05-29 NOTE — Patient Instructions (Signed)
YOU HAD AN ENDOSCOPIC PROCEDURE TODAY AT THE [facility]:   Refer to the procedure report that was given to you for any specific questions about what was found during the examination.  If the procedure report does not answer your questions, please call your gastroenterologist to clarify.  If you requested that your care partner not be given the details of your procedure findings, then the procedure report has been included in a sealed envelope for you to review at your convenience later.  YOU SHOULD EXPECT: Some feelings of bloating in the abdomen. Passage of more gas than usual.  Walking can help get rid of the air that was put into your GI tract during the procedure and reduce the bloating. If you had a lower endoscopy (such as a colonoscopy or flexible sigmoidoscopy) you may notice spotting of blood in your stool or on the toilet paper. If you underwent a bowel prep for your procedure, you may not have a normal bowel movement for a few days.  Please Note:  You might notice some irritation and congestion in your nose or some drainage.  This is from the oxygen used during your procedure.  There is no need for concern and it should clear up in a day or so.  SYMPTOMS TO REPORT IMMEDIATELY:   Following lower endoscopy (colonoscopy or flexible sigmoidoscopy):  Excessive amounts of blood in the stool  Significant tenderness or worsening of abdominal pains  Swelling of the abdomen that is new, acute  Fever of 100F or higher  For urgent or emergent issues, a gastroenterologist can be reached at any hour by calling [phone].   DIET:  We do recommend a small meal at first, but then you may proceed to your regular diet.  Drink plenty of fluids but you should avoid alcoholic beverages for 24 hours.  ACTIVITY:  You should plan to take it easy for the rest of today and you should NOT DRIVE or use heavy machinery until tomorrow (because of the sedation medicines used during the test).     FOLLOW UP: Our staff will call the number listed on your records 48-72 hours following your procedure to check on you and address any questions or concerns that you may have regarding the information given to you following your procedure. If we do not reach you, we will leave a message.  We will attempt to reach you two times.  During this call, we will ask if you have developed any symptoms of COVID 19. If you develop any symptoms (ie: fever, flu-like symptoms, shortness of breath, cough etc.) before then, please call [phone].  If you test positive for Covid 19 in the 2 weeks post procedure, please call and report this information to us.    If any biopsies were taken you will be contacted by phone or by letter within the next 1-3 weeks.  Please call us at [phone] if you have not heard about the biopsies in 3 weeks.    SIGNATURES/CONFIDENTIALITY: You and/or your care partner have signed paperwork which will be entered into your electronic medical record.  These signatures attest to the fact that that the information above on your After Visit Summary has been reviewed and is understood.  Full responsibility of the confidentiality of this discharge information lies with you and/or your care-partner. 

## 2019-05-29 NOTE — Progress Notes (Signed)
Pt's states no medical or surgical changes since previsit or office visit.  Temp: Trevor Bean Vitals: vitals

## 2019-06-02 ENCOUNTER — Telehealth: Payer: Self-pay | Admitting: *Deleted

## 2019-06-02 ENCOUNTER — Telehealth: Payer: Self-pay

## 2019-06-02 NOTE — Telephone Encounter (Signed)
Second post procedure follow up call, no answer 

## 2019-06-02 NOTE — Telephone Encounter (Signed)
No answer, message l left for the patient.

## 2019-06-10 ENCOUNTER — Encounter: Payer: Self-pay | Admitting: Gastroenterology

## 2019-08-17 ENCOUNTER — Other Ambulatory Visit: Payer: Self-pay

## 2019-08-17 ENCOUNTER — Ambulatory Visit (HOSPITAL_COMMUNITY)
Admission: EM | Admit: 2019-08-17 | Discharge: 2019-08-17 | Disposition: A | Discharge status: Home / Self Care | Payer: No Typology Code available for payment source | Attending: Family Medicine | Admitting: Family Medicine

## 2019-08-17 ENCOUNTER — Encounter (HOSPITAL_COMMUNITY): Payer: Self-pay

## 2019-08-17 DIAGNOSIS — M79674 Pain in right toe(s): Secondary | ICD-10-CM

## 2019-08-17 MED ORDER — BACITRACIN ZINC 500 UNIT/GM EX OINT: 1.0000 "application " | TOPICAL_OINTMENT | Freq: Two times a day (BID) | CUTANEOUS | 0 refills | Status: DC

## 2019-08-17 NOTE — Discharge Instructions (Signed)
Please keep the area clean and dry Please apply the ointment.

## 2019-08-17 NOTE — ED Triage Notes (Signed)
Pt states he has pain in his right second toe. Pt he wears a steel toe boot. Pt states he has been using corn pads.
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
## 2019-08-18 ENCOUNTER — Encounter: Payer: Self-pay | Admitting: Family Medicine

## 2019-08-20 ENCOUNTER — Telehealth: Payer: Self-pay | Admitting: Family Medicine

## 2019-08-20 LAB — AEROBIC CULTURE W GRAM STAIN (SUPERFICIAL SPECIMEN)
Culture: NORMAL
Gram Stain: NONE SEEN

## 2019-08-20 NOTE — Telephone Encounter (Signed)
Left VM for patient. If he calls back please have him speak with a nurse/CMA and inform that his would culture has not had any growth of bacteria. How is his toe doing?   If any questions then please take the best time and phone number to call and I will try to call him back.   Rosemarie Ax, MD [facility] 08/20/2019, 9:53 AM

## 2019-08-25 ENCOUNTER — Ambulatory Visit: Payer: No Typology Code available for payment source | Attending: Family Medicine | Admitting: Family Medicine

## 2019-08-25 ENCOUNTER — Other Ambulatory Visit: Payer: Self-pay

## 2019-08-25 ENCOUNTER — Encounter: Payer: Self-pay | Admitting: Family Medicine

## 2019-08-25 VITALS — BP 101/63 | HR 60 | Temp 98.2°F | Ht 69.0 in | Wt 192.0 lb

## 2019-08-25 DIAGNOSIS — M6780 Other specified disorders of synovium and tendon, unspecified site: Secondary | ICD-10-CM | POA: Diagnosis not present

## 2019-08-25 DIAGNOSIS — M205X1 Other deformities of toe(s) (acquired), right foot: Secondary | ICD-10-CM | POA: Diagnosis not present

## 2019-08-25 DIAGNOSIS — L97511 Non-pressure chronic ulcer of other part of right foot limited to breakdown of skin: Secondary | ICD-10-CM | POA: Diagnosis not present

## 2019-08-25 DIAGNOSIS — R2231 Localized swelling, mass and lump, right upper limb: Secondary | ICD-10-CM | POA: Diagnosis not present
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
## 2019-08-25 NOTE — Progress Notes (Signed)
Patient is requesting a referral to a podiatrist.

## 2019-09-11 ENCOUNTER — Ambulatory Visit: Payer: Self-pay

## 2019-09-11 ENCOUNTER — Ambulatory Visit (INDEPENDENT_AMBULATORY_CARE_PROVIDER_SITE_OTHER): Payer: No Typology Code available for payment source | Admitting: Orthopaedic Surgery

## 2019-09-11 ENCOUNTER — Encounter: Payer: Self-pay | Admitting: Orthopaedic Surgery

## 2019-09-11 ENCOUNTER — Other Ambulatory Visit: Payer: Self-pay

## 2019-09-11 ENCOUNTER — Ambulatory Visit: Payer: No Typology Code available for payment source | Admitting: Podiatry

## 2019-09-11 VITALS — Ht 69.0 in | Wt 193.0 lb

## 2019-09-11 DIAGNOSIS — M67441 Ganglion, right hand: Secondary | ICD-10-CM | POA: Diagnosis not present

## 2019-09-11 DIAGNOSIS — M79641 Pain in right hand: Secondary | ICD-10-CM | POA: Insufficient documentation
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
## 2019-09-25 ENCOUNTER — Ambulatory Visit (INDEPENDENT_AMBULATORY_CARE_PROVIDER_SITE_OTHER): Payer: No Typology Code available for payment source | Admitting: Podiatry

## 2019-09-25 DIAGNOSIS — Z5329 Procedure and treatment not carried out because of patient's decision for other reasons: Secondary | ICD-10-CM

## 2019-09-25 NOTE — Progress Notes (Signed)
No show for appt. 

## 2019-10-08 ENCOUNTER — Encounter (HOSPITAL_BASED_OUTPATIENT_CLINIC_OR_DEPARTMENT_OTHER): Payer: Self-pay | Admitting: Orthopaedic Surgery

## 2019-10-08 ENCOUNTER — Other Ambulatory Visit: Payer: Self-pay

## 2019-10-09 ENCOUNTER — Other Ambulatory Visit: Payer: Self-pay

## 2019-10-10 ENCOUNTER — Other Ambulatory Visit (HOSPITAL_COMMUNITY)
Admission: RE | Admit: 2019-10-10 | Discharge: 2019-10-10 | Disposition: A | Discharge status: Home / Self Care | Payer: No Typology Code available for payment source | Source: Ambulatory Visit | Attending: Orthopaedic Surgery | Admitting: Orthopaedic Surgery

## 2019-10-10 DIAGNOSIS — Z01812 Encounter for preprocedural laboratory examination: Secondary | ICD-10-CM | POA: Insufficient documentation

## 2019-10-10 DIAGNOSIS — Z20828 Contact with and (suspected) exposure to other viral communicable diseases: Secondary | ICD-10-CM | POA: Insufficient documentation

## 2019-10-11 LAB — NOVEL CORONAVIRUS, NAA (HOSP ORDER, SEND-OUT TO REF LAB; TAT 18-24 HRS): SARS-CoV-2, NAA: NOT DETECTED

## 2019-10-14 ENCOUNTER — Other Ambulatory Visit: Payer: Self-pay

## 2019-10-14 ENCOUNTER — Encounter (HOSPITAL_BASED_OUTPATIENT_CLINIC_OR_DEPARTMENT_OTHER)
Admission: RE | Disposition: A | Discharge status: Home / Self Care | Payer: Self-pay | Source: Home / Self Care | Attending: Orthopaedic Surgery

## 2019-10-14 ENCOUNTER — Ambulatory Visit (HOSPITAL_BASED_OUTPATIENT_CLINIC_OR_DEPARTMENT_OTHER)
Admission: RE | Admit: 2019-10-14 | Discharge: 2019-10-14 | Disposition: A | Discharge status: Home / Self Care | Payer: No Typology Code available for payment source | Attending: Orthopaedic Surgery | Admitting: Orthopaedic Surgery

## 2019-10-14 ENCOUNTER — Encounter (HOSPITAL_BASED_OUTPATIENT_CLINIC_OR_DEPARTMENT_OTHER): Payer: Self-pay | Admitting: Orthopaedic Surgery

## 2019-10-14 ENCOUNTER — Ambulatory Visit (HOSPITAL_BASED_OUTPATIENT_CLINIC_OR_DEPARTMENT_OTHER): Payer: No Typology Code available for payment source | Admitting: Certified Registered"

## 2019-10-14 DIAGNOSIS — Z87891 Personal history of nicotine dependence: Secondary | ICD-10-CM | POA: Insufficient documentation

## 2019-10-14 DIAGNOSIS — M1990 Unspecified osteoarthritis, unspecified site: Secondary | ICD-10-CM | POA: Diagnosis not present

## 2019-10-14 DIAGNOSIS — M67441 Ganglion, right hand: Secondary | ICD-10-CM | POA: Diagnosis present

## 2019-10-14 HISTORY — PX: CYST EXCISION: SHX5701

## 2019-10-14 HISTORY — DX: Ganglion, unspecified site: M67.40

## 2019-10-14 SURGERY — CYST REMOVAL
Anesthesia: Monitor Anesthesia Care | Site: Hand | Laterality: Right

## 2019-10-14 MED ORDER — BUPIVACAINE HCL (PF) 0.5 % IJ SOLN
INTRAMUSCULAR | Status: AC
  Filled 2019-10-14: qty 30

## 2019-10-14 MED ORDER — CHLORHEXIDINE GLUCONATE 4 % EX LIQD: 60.0000 mL | Freq: Once | CUTANEOUS | Status: DC

## 2019-10-14 MED ORDER — FENTANYL CITRATE (PF) 100 MCG/2ML IJ SOLN
INTRAMUSCULAR | Status: AC
  Filled 2019-10-14: qty 2

## 2019-10-14 MED ORDER — LACTATED RINGERS IV SOLN: INTRAVENOUS | Status: DC

## 2019-10-14 MED ORDER — CEFAZOLIN SODIUM-DEXTROSE 2-4 GM/100ML-% IV SOLN
INTRAVENOUS | Status: AC
  Filled 2019-10-14: qty 100

## 2019-10-14 MED ORDER — CEFAZOLIN SODIUM-DEXTROSE 2-4 GM/100ML-% IV SOLN
2.0000 g | INTRAVENOUS | Status: AC
  Administered 2019-10-14: 2 g via INTRAVENOUS

## 2019-10-14 MED ORDER — MIDAZOLAM HCL 2 MG/2ML IJ SOLN
1.0000 mg | INTRAMUSCULAR | Status: DC | PRN
  Administered 2019-10-14: 2 mg via INTRAVENOUS

## 2019-10-14 MED ORDER — LIDOCAINE HCL (PF) 0.5 % IJ SOLN
INTRAMUSCULAR | Status: DC | PRN
  Administered 2019-10-14: 35 mL via INTRAVENOUS

## 2019-10-14 MED ORDER — ONDANSETRON HCL 4 MG/2ML IJ SOLN
INTRAMUSCULAR | Status: DC | PRN
  Administered 2019-10-14: 4 mg via INTRAVENOUS

## 2019-10-14 MED ORDER — LIDOCAINE 2% (20 MG/ML) 5 ML SYRINGE
INTRAMUSCULAR | Status: AC
  Filled 2019-10-14: qty 5

## 2019-10-14 MED ORDER — HYDROCODONE-ACETAMINOPHEN 5-325 MG PO TABS: 1.0000 | ORAL_TABLET | Freq: Two times a day (BID) | ORAL | 0 refills | Status: DC | PRN

## 2019-10-14 MED ORDER — FENTANYL CITRATE (PF) 100 MCG/2ML IJ SOLN
50.0000 ug | INTRAMUSCULAR | Status: DC | PRN
  Administered 2019-10-14: 50 ug via INTRAVENOUS

## 2019-10-14 MED ORDER — PROPOFOL 10 MG/ML IV BOLUS
INTRAVENOUS | Status: AC
  Filled 2019-10-14: qty 20

## 2019-10-14 MED ORDER — LIDOCAINE HCL (CARDIAC) PF 100 MG/5ML IV SOSY
PREFILLED_SYRINGE | INTRAVENOUS | Status: DC | PRN
  Administered 2019-10-14: 50 mg via INTRAVENOUS

## 2019-10-14 MED ORDER — PROPOFOL 500 MG/50ML IV EMUL
INTRAVENOUS | Status: DC | PRN
  Administered 2019-10-14: 75 ug/kg/min via INTRAVENOUS

## 2019-10-14 MED ORDER — BUPIVACAINE HCL (PF) 0.25 % IJ SOLN
INTRAMUSCULAR | Status: DC | PRN
  Administered 2019-10-14: 10 mL

## 2019-10-14 MED ORDER — MIDAZOLAM HCL 2 MG/2ML IJ SOLN
INTRAMUSCULAR | Status: AC
  Filled 2019-10-14: qty 2

## 2019-10-14 MED ORDER — LIDOCAINE HCL (PF) 1 % IJ SOLN
INTRAMUSCULAR | Status: AC
  Filled 2019-10-14: qty 30

## 2019-10-14 MED ORDER — ONDANSETRON HCL 4 MG PO TABS: 4.0000 mg | ORAL_TABLET | Freq: Three times a day (TID) | ORAL | 0 refills | Status: DC | PRN

## 2019-10-14 MED ORDER — BUPIVACAINE HCL (PF) 0.25 % IJ SOLN
INTRAMUSCULAR | Status: AC
  Filled 2019-10-14: qty 30

## 2019-10-14 SURGICAL SUPPLY — 55 items
BAND RUBBER #18 3X1/16 STRL (MISCELLANEOUS) ×6 IMPLANT
BLADE ARTHRO LOK 4 BEAVER (BLADE) IMPLANT
BLADE ARTHRO LOK 4MM BEAVER (BLADE)
BLADE SURG 15 STRL LF DISP TIS (BLADE) ×2 IMPLANT
BLADE SURG 15 STRL SS (BLADE) ×4
BNDG COHESIVE 1X5 TAN STRL LF (GAUZE/BANDAGES/DRESSINGS) IMPLANT
BNDG CONFORM 2 STRL LF (GAUZE/BANDAGES/DRESSINGS) ×3 IMPLANT
BNDG ELASTIC 3X5.8 VLCR STR LF (GAUZE/BANDAGES/DRESSINGS) ×3 IMPLANT
BNDG ESMARK 4X9 LF (GAUZE/BANDAGES/DRESSINGS) ×3 IMPLANT
BRUSH SCRUB EZ PLAIN DRY (MISCELLANEOUS) IMPLANT
CORD BIPOLAR FORCEPS 12FT (ELECTRODE) ×3 IMPLANT
COVER BACK TABLE REUSABLE LG (DRAPES) ×3 IMPLANT
COVER MAYO STAND REUSABLE (DRAPES) ×3 IMPLANT
COVER WAND RF STERILE (DRAPES) IMPLANT
CUFF TOURN SGL QUICK 18X4 (TOURNIQUET CUFF) IMPLANT
DECANTER SPIKE VIAL GLASS SM (MISCELLANEOUS) IMPLANT
DERMABOND ADVANCED (GAUZE/BANDAGES/DRESSINGS)
DERMABOND ADVANCED .7 DNX12 (GAUZE/BANDAGES/DRESSINGS) IMPLANT
DRAPE EXTREMITY T 121X128X90 (DISPOSABLE) ×3 IMPLANT
DRAPE IMP U-DRAPE 54X76 (DRAPES) ×3 IMPLANT
DRAPE SURG 17X23 STRL (DRAPES) ×3 IMPLANT
GAUZE 4X4 16PLY RFD (DISPOSABLE) IMPLANT
GAUZE SPONGE 4X4 12PLY STRL (GAUZE/BANDAGES/DRESSINGS) ×3 IMPLANT
GAUZE XEROFORM 1X8 LF (GAUZE/BANDAGES/DRESSINGS) ×3 IMPLANT
GLOVE BIO SURGEON STRL SZ7 (GLOVE) ×3 IMPLANT
GLOVE BIOGEL PI IND STRL 6.5 (GLOVE) ×1 IMPLANT
GLOVE BIOGEL PI IND STRL 7.0 (GLOVE) ×1 IMPLANT
GLOVE BIOGEL PI IND STRL 7.5 (GLOVE) ×1 IMPLANT
GLOVE BIOGEL PI INDICATOR 6.5 (GLOVE) ×2
GLOVE BIOGEL PI INDICATOR 7.0 (GLOVE) ×2
GLOVE BIOGEL PI INDICATOR 7.5 (GLOVE) ×2
GLOVE ECLIPSE 7.0 STRL STRAW (GLOVE) ×3 IMPLANT
GLOVE SKINSENSE NS SZ7.5 (GLOVE) ×2
GLOVE SKINSENSE STRL SZ7.5 (GLOVE) ×1 IMPLANT
GLOVE SURG SYN 7.5  E (GLOVE) ×4
GLOVE SURG SYN 7.5 E (GLOVE) ×2 IMPLANT
GOWN STRL REIN XL XLG (GOWN DISPOSABLE) ×3 IMPLANT
GOWN STRL REUS W/ TWL LRG LVL3 (GOWN DISPOSABLE) ×1 IMPLANT
GOWN STRL REUS W/ TWL XL LVL3 (GOWN DISPOSABLE) ×2 IMPLANT
GOWN STRL REUS W/TWL LRG LVL3 (GOWN DISPOSABLE) ×2
GOWN STRL REUS W/TWL XL LVL3 (GOWN DISPOSABLE) ×4
NEEDLE HYPO 25X1 1.5 SAFETY (NEEDLE) ×3 IMPLANT
NS IRRIG 1000ML POUR BTL (IV SOLUTION) ×3 IMPLANT
PACK BASIN DAY SURGERY FS (CUSTOM PROCEDURE TRAY) ×3 IMPLANT
STOCKINETTE 4X48 STRL (DRAPES) IMPLANT
SUT ETHILON 2 0 FS 18 (SUTURE) IMPLANT
SUT ETHILON 4 0 PS 2 18 (SUTURE) ×3 IMPLANT
SUT VIC AB 0 CT1 27 (SUTURE)
SUT VIC AB 0 CT1 27XBRD ANBCTR (SUTURE) IMPLANT
SUT VIC AB 2-0 CT1 27 (SUTURE)
SUT VIC AB 2-0 CT1 TAPERPNT 27 (SUTURE) IMPLANT
SYR BULB 3OZ (MISCELLANEOUS) ×3 IMPLANT
SYR CONTROL 10ML LL (SYRINGE) IMPLANT
TOWEL GREEN STERILE FF (TOWEL DISPOSABLE) ×3 IMPLANT
TRAY DSU PREP LF (CUSTOM PROCEDURE TRAY) ×3 IMPLANT
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
## 2019-10-14 NOTE — Transfer of Care (Signed)
Immediate Anesthesia Transfer of Care Note  Patient: Trevor Bean  Procedure(s) Performed: RIGHT RING FINGER GANGLION CYST REMOVAL (Right Hand)  Patient Location: PACU  Anesthesia Type:MAC  Level of Consciousness: awake, alert  and oriented  Airway & Oxygen Therapy: Patient Spontanous Breathing and Patient connected to face mask oxygen  Post-op Assessment: Report given to RN and Post -op Vital signs reviewed and stable  Post vital signs: Reviewed and stable  Last Vitals:  Vitals Value Taken Time  BP    Temp    Pulse    Resp    SpO2      Last Pain:  Vitals:   10/14/19 0645  TempSrc: Oral  PainSc: 0-No pain         Complications: No apparent anesthesia complications

## 2019-10-14 NOTE — Discharge Instructions (Signed)
 Postoperative instructions:  Weightbearing instructions: no lifting more than 10 lbs  Dressing instructions: Keep your dressing and/or splint clean and dry at all times.  It will be removed at your first post-operative appointment.  Your stitches and/or staples will be removed at this visit.  Incision instructions:  Do not soak your incision for 3 weeks after surgery.  If the incision gets wet, pat dry and do not scrub the incision.  Pain control:  You have been given a prescription to be taken as directed for post-operative pain control.  In addition, elevate the operative extremity above the heart at all times to prevent swelling and throbbing pain.  Take over-the-counter Colace, 100mg by mouth twice a day while taking narcotic pain medications to help prevent constipation.  Follow up appointments: 1) 7 days for wound check. 2) Dr. Xu as scheduled.   -------------------------------------------------------------------------------------------------------------  After Surgery Pain Control:  After your surgery, post-surgical discomfort or pain is likely. This discomfort can last several days to a few weeks. At certain times of the day your discomfort may be more intense.  Did you receive a nerve block?  A nerve block can provide pain relief for one hour to two days after your surgery. As long as the nerve block is working, you will experience little or no sensation in the area the surgeon operated on.  As the nerve block wears off, you will begin to experience pain or discomfort. It is very important that you begin taking your prescribed pain medication before the nerve block fully wears off. Treating your pain at the first sign of the block wearing off will ensure your pain is better controlled and more tolerable when full-sensation returns. Do not wait until the pain is intolerable, as the medicine will be less effective. It is better to treat pain in advance than to try and catch up.    General Anesthesia:  If you did not receive a nerve block during your surgery, you will need to start taking your pain medication shortly after your surgery and should continue to do so as prescribed by your surgeon.  Pain Medication:  Most commonly we prescribe Vicodin and Percocet for post-operative pain. Both of these medications contain a combination of acetaminophen (Tylenol) and a narcotic to help control pain.   It takes between 30 and 45 minutes before pain medication starts to work. It is important to take your medication before your pain level gets too intense.   Nausea is a common side effect of many pain medications. You will want to eat something before taking your pain medicine to help prevent nausea.   If you are taking a prescription pain medication that contains acetaminophen, we recommend that you do not take additional over the counter acetaminophen (Tylenol).  Other pain relieving options:   Using a cold pack to ice the affected area a few times a day (15 to 20 minutes at a time) can help to relieve pain, reduce swelling and bruising.   Elevation of the affected area can also help to reduce pain and swelling.   Post Anesthesia Home Care Instructions  Activity: Get plenty of rest for the remainder of the day. A responsible individual must stay with you for 24 hours following the procedure.  For the next 24 hours, DO NOT: -Drive a car -Operate machinery -Drink alcoholic beverages -Take any medication unless instructed by your physician -Make any legal decisions or sign important papers.  Meals: Start with liquid foods such as gelatin   or soup. Progress to regular foods as tolerated. Avoid greasy, spicy, heavy foods. If nausea and/or vomiting occur, drink only clear liquids until the nausea and/or vomiting subsides. Call your physician if vomiting continues.  Special Instructions/Symptoms: Your throat may feel dry or sore from the anesthesia or the breathing tube  placed in your throat during surgery. If this causes discomfort, gargle with warm salt water. The discomfort should disappear within 24 hours.  If you had a scopolamine patch placed behind your ear for the management of post- operative nausea and/or vomiting:  1. The medication in the patch is effective for 72 hours, after which it should be removed.  Wrap patch in a tissue and discard in the trash. Wash hands thoroughly with soap and water. 2. You may remove the patch earlier than 72 hours if you experience unpleasant side effects which may include dry mouth, dizziness or visual disturbances. 3. Avoid touching the patch. Wash your hands with soap and water after contact with the patch.      

## 2019-10-14 NOTE — Op Note (Signed)
   Date of Surgery: 10/14/2019  INDICATIONS: Mr. Kolbe is a 53 y.o.-year-old male with a right ring finger flexor tendon sheath ganglion cyst;  The patient did consent to the procedure after discussion of the risks and benefits.  PREOPERATIVE DIAGNOSIS: Right ring finger flexor tendon sheath ganglion cyst  POSTOPERATIVE DIAGNOSIS: Same.  PROCEDURE: Removal of ganglion cyst from right ring finger flexor tendon sheath  SURGEON: N. Eduard Roux, M.D.  ASSIST: Ciro Backer [place], [place]; necessary for the timely completion of procedure   ANESTHESIA:  Bier block, MAC  IV FLUIDS AND URINE: See anesthesia.  ESTIMATED BLOOD LOSS: minimal mL.  IMPLANTS: none  DRAINS: none  COMPLICATIONS: see description of procedure.  DESCRIPTION OF PROCEDURE: The patient was brought to the operating room and placed supine on the operating table.  The patient had been signed prior to the procedure and this was documented. The patient had the anesthesia placed by the anesthesiologist.  A time-out was performed to confirm that this was the correct patient, site, side and location. The patient did receive antibiotics prior to the incision and was re-dosed during the procedure as needed at indicated intervals.  A tourniquet was placed on the upper forearm.  The patient had the operative extremity prepped and draped in the standard surgical fashion.    A standard Brunner's incision was made directly overlying the proximal phalanx of the ring finger.  Skin hooks were placed for retraction.  Blunt dissection with tenotomy scissors was performed down onto the ganglion cyst and around.  The ganglion cyst was carefully bluntly dissected off of the flexor tendon sheath.  The neurovascular bundles were protected at all times.  Once the cyst was removed there was a small defect in the A2 pulley distally but proximally the A2 pulley was fully intact.  The tendon quality itself was good.  We then thoroughly irrigated the  surgical wound and the tourniquet was deflated and hemostasis was obtained.  Local anesthetic was placed.  Skin was closed with 4-0 nylon suture.  Sterile dressings were applied.  Patient tolerated the procedure well had no immediate complications.  POSTOPERATIVE PLAN: Discharge home and follow-up in 1 week for wound check.  Azucena Cecil, MD 8:01 AM

## 2019-10-14 NOTE — Anesthesia Postprocedure Evaluation (Signed)
Anesthesia Post Note  Patient: Trevor Bean  Procedure(s) Performed: RIGHT RING FINGER GANGLION CYST REMOVAL (Right Hand)     Patient location during evaluation: PACU Anesthesia Type: MAC and Bier Block Level of consciousness: awake and alert Pain management: pain level controlled Vital Signs Assessment: post-procedure vital signs reviewed and stable Respiratory status: spontaneous breathing, nonlabored ventilation and respiratory function stable Cardiovascular status: stable, blood pressure returned to baseline and bradycardic Anesthetic complications: no    Last Vitals:  Vitals:   10/14/19 0815 10/14/19 0830  BP:  125/62  Pulse: (!) 45 (!) 44  Resp: 18 13  Temp:    SpO2: 99% 98%    Last Pain:  Vitals:   10/14/19 0830  TempSrc:   PainSc: 0-No pain                 Audry Pili

## 2019-10-14 NOTE — Anesthesia Preprocedure Evaluation (Addendum)
Anesthesia Evaluation  Patient identified by MRN, date of birth, ID band Patient awake    Reviewed: Allergy & Precautions, NPO status , Patient's Chart, lab work & pertinent test results  History of Anesthesia Complications Negative for: history of anesthetic complications  Airway Mallampati: I  TM Distance: >3 FB Neck ROM: Full    Dental  (+) Dental Advisory Given, Teeth Intact   Pulmonary Patient abstained from smoking., former smoker,    Pulmonary exam normal        Cardiovascular negative cardio ROS Normal cardiovascular exam     Neuro/Psych negative neurological ROS  negative psych ROS   GI/Hepatic negative GI ROS, Neg liver ROS,   Endo/Other  negative endocrine ROS  Renal/GU negative Renal ROS     Musculoskeletal  (+) Arthritis ,  Gout    Abdominal   Peds  Hematology negative hematology ROS (+)   Anesthesia Other Findings Covid neg 12/19   Reproductive/Obstetrics                            Anesthesia Physical Anesthesia Plan  ASA: I  Anesthesia Plan: MAC and Bier Block and Bier Block-LIDOCAINE ONLY   Post-op Pain Management:    Induction: Intravenous  PONV Risk Score and Plan: 1 and Propofol infusion and Treatment may vary due to age or medical condition  Airway Management Planned: Nasal Cannula and Natural Airway  Additional Equipment: None  Intra-op Plan:   Post-operative Plan:   Informed Consent: I have reviewed the patients History and Physical, chart, labs and discussed the procedure including the risks, benefits and alternatives for the proposed anesthesia with the patient or authorized representative who has indicated his/her understanding and acceptance.       Plan Discussed with: CRNA and Anesthesiologist  Anesthesia Plan Comments:       Anesthesia Quick Evaluation

## 2019-10-15 ENCOUNTER — Encounter: Payer: Self-pay | Admitting: *Deleted

## 2019-10-15 LAB — SURGICAL PATHOLOGY

## 2019-10-27 ENCOUNTER — Telehealth: Payer: Self-pay | Admitting: Orthopaedic Surgery

## 2019-10-27 NOTE — Telephone Encounter (Signed)
Patient called needing note stating when he can return to work and what restrictions he will have when he returns. The number to contact patient is [phone]

## 2019-10-28 ENCOUNTER — Encounter: Payer: Self-pay | Admitting: Orthopaedic Surgery

## 2019-10-28 ENCOUNTER — Ambulatory Visit (INDEPENDENT_AMBULATORY_CARE_PROVIDER_SITE_OTHER): Payer: No Typology Code available for payment source | Admitting: Orthopaedic Surgery

## 2019-10-28 ENCOUNTER — Other Ambulatory Visit: Payer: Self-pay

## 2019-10-28 VITALS — Ht 69.0 in | Wt 183.0 lb

## 2019-10-28 DIAGNOSIS — M67441 Ganglion, right hand: Secondary | ICD-10-CM

## 2019-10-28 NOTE — Telephone Encounter (Signed)
See message. When can he RTW and what restrictions if any ?

## 2019-10-28 NOTE — Telephone Encounter (Signed)
I called x 2. Phone rings twice and then states call cannot be completed at this time and to try again later. If pt calls back, please ask what he does for work.

## 2019-10-28 NOTE — Telephone Encounter (Signed)
What does he do for work?

## 2019-10-28 NOTE — Progress Notes (Signed)
Patient ID: Trevor Bean, male   DOB: February 17, 1966, 54 y.o.   MRN: [id number]  Trevor Bean is a 54 year old gentleman who is 2 weeks status post ganglion cyst removal from his right ring finger.  He is doing well today.  This is his first follow-up appointment.  He does endorse some swelling.  Denies any constitutional symptoms or signs or symptoms of infection.  Surgical incisions fully healed.  We remove the sutures today.  Bactroban ointment and Band-Aid was applied.  Limit lifting to less than 10 pounds for another couple weeks.  Questions encouraged and answered.  Follow-up as needed.  Work note was provided today excusing him for the time that he took off for surgery.

## 2019-10-29 NOTE — Telephone Encounter (Signed)
I tried to reach patient twice yesterday.  Sending back to you in case he calls today. Thanks.

## 2020-01-01 ENCOUNTER — Other Ambulatory Visit: Payer: Self-pay

## 2020-01-01 ENCOUNTER — Encounter (HOSPITAL_COMMUNITY): Payer: Self-pay

## 2020-01-01 ENCOUNTER — Ambulatory Visit (HOSPITAL_COMMUNITY)
Admission: EM | Admit: 2020-01-01 | Discharge: 2020-01-01 | Disposition: A | Discharge status: Home / Self Care | Payer: No Typology Code available for payment source | Attending: Family Medicine | Admitting: Family Medicine

## 2020-01-01 DIAGNOSIS — M79642 Pain in left hand: Secondary | ICD-10-CM | POA: Diagnosis not present

## 2020-01-01 DIAGNOSIS — M79641 Pain in right hand: Secondary | ICD-10-CM

## 2020-01-01 MED ORDER — PREDNISONE 10 MG (21) PO TBPK: ORAL_TABLET | ORAL | 0 refills | Status: DC

## 2020-01-01 NOTE — Discharge Instructions (Addendum)
I believe that your pain and swelling is most likely related to arthritis and possibly rheumatoid arthritis. Treating with prednisone taper.  Take the medication as prescribed. Take with food Follow-up with the community health and wellness for further management

## 2020-01-01 NOTE — ED Triage Notes (Signed)
Pt is here with right hand pain that started 3 days ago, states he had hand surgery on it 10/14/2019. Pt has not taken any meds to relieve discomfort.
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
## 2020-01-27 ENCOUNTER — Other Ambulatory Visit: Payer: Self-pay

## 2020-01-27 ENCOUNTER — Ambulatory Visit: Payer: No Typology Code available for payment source | Attending: Family Medicine | Admitting: Family Medicine

## 2020-01-27 ENCOUNTER — Encounter: Payer: Self-pay | Admitting: Family Medicine

## 2020-01-27 VITALS — BP 106/55 | HR 60 | Ht 69.0 in | Wt 187.6 lb

## 2020-01-27 DIAGNOSIS — Z0000 Encounter for general adult medical examination without abnormal findings: Secondary | ICD-10-CM | POA: Diagnosis not present

## 2020-01-27 DIAGNOSIS — Z13228 Encounter for screening for other metabolic disorders: Secondary | ICD-10-CM

## 2020-01-27 DIAGNOSIS — Z125 Encounter for screening for malignant neoplasm of prostate: Secondary | ICD-10-CM | POA: Diagnosis not present

## 2020-01-27 NOTE — Patient Instructions (Signed)

## 2020-01-27 NOTE — Progress Notes (Signed)
Has been having pain in his hands. Has low energy at times.
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
## 2020-01-28 LAB — CBC WITH DIFFERENTIAL/PLATELET
Basophils Absolute: 0 10*3/uL (ref 0.0–0.2)
Basos: 1 %
EOS (ABSOLUTE): 0.2 10*3/uL (ref 0.0–0.4)
Eos: 4 %
Hematocrit: 35 % — ABNORMAL LOW (ref 37.5–51.0)
Hemoglobin: 11.4 g/dL — ABNORMAL LOW (ref 13.0–17.7)
Immature Grans (Abs): 0 10*3/uL (ref 0.0–0.1)
Immature Granulocytes: 0 %
Lymphocytes Absolute: 1.9 10*3/uL (ref 0.7–3.1)
Lymphs: 42 %
MCH: 24.2 pg — ABNORMAL LOW (ref 26.6–33.0)
MCHC: 32.6 g/dL (ref 31.5–35.7)
MCV: 74 fL — ABNORMAL LOW (ref 79–97)
Monocytes Absolute: 0.5 10*3/uL (ref 0.1–0.9)
Monocytes: 10 %
Neutrophils Absolute: 2 10*3/uL (ref 1.4–7.0)
Neutrophils: 43 %
Platelets: 208 10*3/uL (ref 150–450)
RBC: 4.71 x10E6/uL (ref 4.14–5.80)
RDW: 14.6 % (ref 11.6–15.4)
WBC: 4.5 10*3/uL (ref 3.4–10.8)

## 2020-01-28 LAB — PSA, TOTAL AND FREE
PSA, Free Pct: 24 %
PSA, Free: 0.12 ng/mL
Prostate Specific Ag, Serum: 0.5 ng/mL (ref 0.0–4.0)

## 2020-01-28 LAB — VITAMIN D 25 HYDROXY (VIT D DEFICIENCY, FRACTURES): Vit D, 25-Hydroxy: 33.3 ng/mL (ref 30.0–100.0)

## 2020-01-28 LAB — TSH: TSH: 0.7 u[IU]/mL (ref 0.450–4.500)

## 2020-01-28 LAB — T4, FREE: Free T4: 1.13 ng/dL (ref 0.82–1.77)

## 2020-01-29 ENCOUNTER — Telehealth: Payer: Self-pay

## 2020-01-29 NOTE — Telephone Encounter (Signed)
Patient was called and a voicemail was left informing patient to return phone call for lab results. 

## 2020-01-29 NOTE — Telephone Encounter (Signed)
-----   Message from Charlott Rakes, MD sent at 01/28/2020  1:41 PM EDT ----- Please inform the patient that labs are normal. Thank you.

## 2020-02-01 NOTE — Telephone Encounter (Signed)
-----   Message from Charlott Rakes, MD sent at 01/28/2020  1:41 PM EDT ----- Please inform the patient that labs are normal. Thank you.

## 2020-02-01 NOTE — Telephone Encounter (Signed)
Patient name and DOB has been verified Patient was informed of lab results. Patient had no questions.  

## 2020-02-10 ENCOUNTER — Ambulatory Visit: Payer: No Typology Code available for payment source | Attending: Family Medicine | Admitting: Family Medicine

## 2020-02-10 ENCOUNTER — Other Ambulatory Visit: Payer: Self-pay

## 2020-04-01 ENCOUNTER — Encounter: Payer: Self-pay | Admitting: Podiatry

## 2020-04-01 ENCOUNTER — Ambulatory Visit (INDEPENDENT_AMBULATORY_CARE_PROVIDER_SITE_OTHER): Payer: No Typology Code available for payment source

## 2020-04-01 ENCOUNTER — Ambulatory Visit (INDEPENDENT_AMBULATORY_CARE_PROVIDER_SITE_OTHER): Payer: No Typology Code available for payment source | Admitting: Podiatry

## 2020-04-01 ENCOUNTER — Other Ambulatory Visit: Payer: Self-pay

## 2020-04-01 VITALS — BP 105/60 | HR 50 | Temp 97.7°F | Resp 16

## 2020-04-01 DIAGNOSIS — M2042 Other hammer toe(s) (acquired), left foot: Secondary | ICD-10-CM

## 2020-04-01 DIAGNOSIS — M2041 Other hammer toe(s) (acquired), right foot: Secondary | ICD-10-CM

## 2020-04-01 DIAGNOSIS — M778 Other enthesopathies, not elsewhere classified: Secondary | ICD-10-CM

## 2020-04-01 DIAGNOSIS — M205X1 Other deformities of toe(s) (acquired), right foot: Secondary | ICD-10-CM

## 2020-04-01 DIAGNOSIS — M779 Enthesopathy, unspecified: Secondary | ICD-10-CM

## 2020-04-01 NOTE — Progress Notes (Signed)
   Subjective:    Patient ID: Trevor Bean, male    DOB: August 20, 1966, 54 y.o.   MRN: [id number]  HPI    Review of Systems  All other systems reviewed and are negative.      Objective:   Physical Exam        Assessment & Plan:

## 2020-04-03 NOTE — Progress Notes (Signed)
Subjective:   Patient ID: Trevor Bean, male   DOB: 54 y.o.   MRN: [id number]   HPI Patient states he is had a lot of thickness in both of his skin of both feet and is developed pain between the hallux and second toe right foot with digital deformity and also between the fourth and fifth toes of the left foot that is painful when pressed.  Patient states this is been ongoing and gradually becoming more of an issue over the years and patient does not smoke likes to be active and wears hard type shoes with steel toes mostly at work   Review of Systems  All other systems reviewed and are negative.       Objective:  Physical Exam Vitals and nursing note reviewed.  Constitutional:      Appearance: He is well-developed.  Pulmonary:     Effort: Pulmonary effort is normal.  Musculoskeletal:        General: Normal range of motion.  Skin:    General: Skin is warm.  Neurological:     Mental Status: He is alert.     Neurovascular status intact muscle strength adequate range of motion within normal limits.  Patient is found to have a keratotic lesion second digit right that is painful with moderate bunion deformity and has significant loss of motion first MPJ right with spurring around the head of the bone.  Left fourth interspace shows keratotic lesion that is painful but not to the same degree as right foot.  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  I do think ultimately this will probably create and require a first MPJ fusion but at this point I went ahead and I put some ejection around the joint in a periarticular fashion 3 mg Kenalog 5 mg Xylocaine and advised on rigid bottom shoes and also debrided lesion  second digit with padding.  All of this can be corrected together but we will make that decision based on the response to conservative and we will see him back in the next 2 months  X-rays indicate quite a bit of spurring around the first metatarsal right with narrowing of the joint and a flattened surface with the left showing moderate rotation signed visit

## 2020-05-26 ENCOUNTER — Ambulatory Visit: Payer: No Typology Code available for payment source | Admitting: Podiatry

## 2020-05-27 ENCOUNTER — Ambulatory Visit: Payer: No Typology Code available for payment source | Admitting: Podiatry

## 2020-06-06 ENCOUNTER — Other Ambulatory Visit: Payer: Self-pay

## 2020-06-06 ENCOUNTER — Ambulatory Visit (HOSPITAL_COMMUNITY)
Admission: EM | Admit: 2020-06-06 | Discharge: 2020-06-06 | Disposition: A | Discharge status: Home / Self Care | Payer: No Typology Code available for payment source | Attending: Family Medicine | Admitting: Family Medicine

## 2020-06-06 ENCOUNTER — Encounter (HOSPITAL_COMMUNITY): Payer: Self-pay | Admitting: Emergency Medicine

## 2020-06-06 DIAGNOSIS — L859 Epidermal thickening, unspecified: Secondary | ICD-10-CM

## 2020-06-06 MED ORDER — BETAMETHASONE DIPROPIONATE 0.05 % EX OINT: TOPICAL_OINTMENT | Freq: Two times a day (BID) | CUTANEOUS | 0 refills | Status: DC
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
## 2020-06-06 NOTE — ED Triage Notes (Signed)
Patient has dry /callused hands.  At the base of right thumb, there are cracks/creases in skin.  The skin looks thickened.  Patient says it has been sore for 2 weeks

## 2020-06-06 NOTE — Discharge Instructions (Signed)
Apply small amount of cream 2 times a day Wear the thumb brace to keep from moving and irritating this area See Dr. Rosiland Oz in follow-up

## 2020-06-09 ENCOUNTER — Ambulatory Visit: Payer: No Typology Code available for payment source | Admitting: Podiatry

## 2020-06-09 ENCOUNTER — Other Ambulatory Visit: Payer: Self-pay

## 2020-09-29 ENCOUNTER — Ambulatory Visit: Payer: Self-pay | Attending: Critical Care Medicine

## 2020-09-29 DIAGNOSIS — Z23 Encounter for immunization: Secondary | ICD-10-CM

## 2020-09-29 NOTE — Progress Notes (Signed)
   Covid-19 Vaccination Clinic  Name:  Trevor Bean    MRN: [id number] DOB: [birth year]  09/29/2020  Trevor Bean was observed post Covid-19 immunization for 15 minutes without incident. He was provided with Vaccine Information Sheet and instruction to access the V-Safe system.   Trevor Bean was instructed to call 911 with any severe reactions post vaccine: Marland Kitchen Difficulty breathing  . Swelling of face and throat  . A fast heartbeat  . A bad rash all over body  . Dizziness and weakness   Immunizations Administered    Name Date Dose VIS Date Route   Pfizer COVID-19 Vaccine 09/29/2020  3:24 PM 0.3 mL 08/10/2020 Intramuscular   Manufacturer: [place]   Lot: [id number]   NDC: [address]-0

## 2020-11-21 ENCOUNTER — Other Ambulatory Visit: Payer: Self-pay

## 2020-11-21 ENCOUNTER — Encounter: Payer: Self-pay | Admitting: Family Medicine

## 2020-11-21 ENCOUNTER — Ambulatory Visit: Payer: Self-pay | Attending: Family Medicine | Admitting: Family Medicine

## 2020-11-21 VITALS — BP 118/75 | HR 55 | Ht 69.0 in | Wt 202.0 lb

## 2020-11-21 DIAGNOSIS — Z113 Encounter for screening for infections with a predominantly sexual mode of transmission: Secondary | ICD-10-CM

## 2020-11-21 DIAGNOSIS — M542 Cervicalgia: Secondary | ICD-10-CM

## 2020-11-21 NOTE — Progress Notes (Signed)
Pain in left side of throat. Would like STD screening.

## 2020-11-21 NOTE — Patient Instructions (Signed)
Cervical Sprain A cervical sprain is a stretch or tear in one or more of the ligaments in the neck. Ligaments are the tissues that connect bones. Cervical sprains can range from mild to severe. Severe cervical sprains can cause the spinal bones (vertebrae) in the neck to be unstable. This can result in spinal cord damage and in serious nervous system problems. The time that it takes for a cervical sprain to heal depends on the cause and extent of the injury. Most cervical sprains heal in 4-6 weeks. What are the causes? Cervical sprains may be caused by trauma, such as an injury from a motor vehicle accident, a fall, or a sudden forward and backward whipping movement of the head and neck (whiplash injury). Mild cervical sprains may be caused by wear and tear over time. What increases the risk? The following factors may make you more likely to develop this condition:  Participating in activities that have a high risk of trauma to the neck. These include contact sports, auto racing, gymnastics, and diving.  Taking risks when driving or riding in a motor vehicle.  Osteoarthritis of the spine.  Poor strength and flexibility of the neck.  A previous neck injury.  Poor posture.  Spending long periods in certain positions that put stress on the neck, such as sitting at a computer for a long time. What are the signs or symptoms? Symptoms of this condition include:  Pain, soreness, stiffness, tenderness, swelling, or a burning sensation in the front, back, or sides of the neck, shoulders, or upper back.  Sudden tightening of neck muscles (spasms).  Limited ability to move the neck.  Headache.  Dizziness.  Nausea or vomiting.  Weakness, numbness, or tingling in a hand or an arm. Symptoms may develop right away after injury, or they may develop over a few days. In some cases, symptoms may go away with treatment and return (recur) over time. How is this diagnosed? This condition may be  diagnosed based on:  Your medical history.  Your symptoms.  Any recent injuries or known neck problems that you have, such as arthritis in the neck.  A physical exam.  Imaging tests, such as X-rays, MRI, and CT scan. How is this treated? This condition is treated by resting and icing the injured area and doing physical therapy exercises. Heat therapy may be used 2-3 days after the injury occurred if there is no swelling. Depending on the severity of your condition, treatment may also include:  Keeping your neck in place (immobilized) for periods of time. This may be done using: ? A cervical collar. This supports your chin and the back of your head. ? A cervical traction device. This is a sling that holds up your head. The device removes weight and pressure from your neck, and it may help to relieve pain.  Medicines that help to relieve pain and inflammation.  Medicines that help to relax your muscles (muscle relaxants).  Surgery. This is rare. Follow these instructions at home: Medicines  Take over-the-counter and prescription medicines only as told by your health care provider.  Ask your health care provider if the medicine prescribed to you: ? Requires you to avoid driving or using heavy machinery. ? Can cause constipation. You may need to take these actions to prevent or treat constipation:  Drink enough fluid to keep your urine pale yellow.  Take over-the-counter or prescription medicines.  Eat foods that are high in fiber, such as beans, whole grains, and fresh fruits   and vegetables.  Limit foods that are high in fat and processed sugars, such as fried or sweet foods.   If you have a cervical collar:  Wear the collar as told by your health care provider. Do not remove it unless told.  Ask before making any adjustments to your collar.  If you have long hair, keep it outside of the collar.  Ask your health care provider if you may remove the collar for cleaning and  bathing. If so: ? Follow instructions about how to remove it safely. ? Clean it by hand with mild soap and water and air-dry it completely. ? If your collar has removable pads, remove them every 1-2 days and wash them by hand with soap and water. Let them air-dry completely before putting them back in the collar.  Tell your health care provider if your skin under the collar has irritation or sores. Managing pain, stiffness, and swelling  If directed, use a cervical traction device as told.  If directed, put ice on the affected area. To do this: ? Put ice in a plastic bag. ? Place a towel between your skin and the bag. ? Leave the ice on for 20 minutes, 2-3 times a day.  If directed, apply heat to the affected area before you do your physical therapy or as often as told by your health care provider. Use the heat source that your health care provider recommends, such as a moist heat pack or a heating pad. ? Place a towel between your skin and the heat source. ? Leave the heat on for 20-30 minutes. ? Remove the heat if your skin turns bright red. This is especially important if you are unable to feel pain, heat, or cold. You may have a greater risk of getting burned.      Activity  Do not drive while wearing a cervical collar. If you do not have a cervical collar, ask if it is safe to drive while your neck heals.  Do not lift anything that is heavier than 10 lb (4.5 kg), or the limit that you are told, until your health care provider says that it is safe.  Rest as told by your health care provider.  If physical therapy was prescribed, do exercises as told by your health care provider or physical therapist.  Return to your normal activities as told by your health care provider. Avoid positions and activities that make your symptoms worse. Ask your health care provider what activities are safe for you. General instructions  Do not use any products that contain nicotine or tobacco, such  as cigarettes, e-cigarettes, and chewing tobacco. These can delay healing. If you need help quitting, ask your health care provider.  Keep all follow-up visits as told by your health care provider or physical therapist. This is important. How is this prevented? To prevent a cervical sprain from happening again:  Use and maintain good posture. Make any needed adjustments to your workstation to help you do this.  Exercise regularly as told by your health care provider or physical therapist.  Avoid risky activities that may cause a cervical sprain. Contact a health care provider if you have:  Symptoms that get worse or do not get better after 2 weeks of treatment.  Pain that gets worse or does not get better with medicine.  New, unexplained symptoms.  Sores or irritated skin on your neck from wearing your cervical collar. Get help right away if:  You have severe pain.    You develop numbness, tingling, or weakness in any part of your body.  You cannot move a part of your body (you have paralysis).  You have neck pain along with severe dizziness or headache. Summary  A cervical sprain is a stretch or tear in one or more of the ligaments in the neck.  Cervical sprains may be caused by trauma, such as an injury from a motor vehicle accident, a fall, or a sudden forward and backward whipping movement of the head and neck (whiplash injury).  Symptoms may develop right away after injury, or they may develop over a few days.  This condition may be treated with rest, ice, heat, medicines, physical therapy, and surgery. This information is not intended to replace advice given to you by your health care provider. Make sure you discuss any questions you have with your health care provider. Document Revised: 06/17/2019 Document Reviewed: 06/17/2019 Elsevier Patient Education  2021 Elsevier Inc.  
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
## 2020-11-22 LAB — CMP14+EGFR
ALT: 17 IU/L (ref 0–44)
AST: 16 IU/L (ref 0–40)
Albumin/Globulin Ratio: 1.5 (ref 1.2–2.2)
Albumin: 4.1 g/dL (ref 3.8–4.9)
Alkaline Phosphatase: 60 IU/L (ref 44–121)
BUN/Creatinine Ratio: 12 (ref 9–20)
BUN: 10 mg/dL (ref 6–24)
Bilirubin Total: 0.3 mg/dL (ref 0.0–1.2)
CO2: 25 mmol/L (ref 20–29)
Calcium: 9.1 mg/dL (ref 8.7–10.2)
Chloride: 104 mmol/L (ref 96–106)
Creatinine, Ser: 0.84 mg/dL (ref 0.76–1.27)
GFR calc Af Amer: 116 mL/min/{1.73_m2} (ref >59)
GFR calc non Af Amer: 100 mL/min/{1.73_m2} (ref >59)
Globulin, Total: 2.7 g/dL (ref 1.5–4.5)
Glucose: 103 mg/dL — ABNORMAL HIGH (ref 65–99)
Potassium: 3.8 mmol/L (ref 3.5–5.2)
Sodium: 141 mmol/L (ref 134–144)
Total Protein: 6.8 g/dL (ref 6.0–8.5)

## 2020-11-22 LAB — URINE CYTOLOGY ANCILLARY ONLY
Chlamydia: NEGATIVE
Comment: NEGATIVE
Comment: NEGATIVE
Comment: NORMAL
Neisseria Gonorrhea: NEGATIVE
Trichomonas: NEGATIVE

## 2020-11-22 LAB — CBC WITH DIFFERENTIAL/PLATELET
Basophils Absolute: 0 10*3/uL (ref 0.0–0.2)
Basos: 1 %
EOS (ABSOLUTE): 0.3 10*3/uL (ref 0.0–0.4)
Eos: 7 %
Hematocrit: 39.1 % (ref 37.5–51.0)
Hemoglobin: 12.7 g/dL — ABNORMAL LOW (ref 13.0–17.7)
Immature Grans (Abs): 0 10*3/uL (ref 0.0–0.1)
Immature Granulocytes: 0 %
Lymphocytes Absolute: 2.3 10*3/uL (ref 0.7–3.1)
Lymphs: 48 %
MCH: 24.3 pg — ABNORMAL LOW (ref 26.6–33.0)
MCHC: 32.5 g/dL (ref 31.5–35.7)
MCV: 75 fL — ABNORMAL LOW (ref 79–97)
Monocytes Absolute: 0.4 10*3/uL (ref 0.1–0.9)
Monocytes: 8 %
Neutrophils Absolute: 1.7 10*3/uL (ref 1.4–7.0)
Neutrophils: 36 %
Platelets: 227 10*3/uL (ref 150–450)
RBC: 5.23 x10E6/uL (ref 4.14–5.80)
RDW: 14.4 % (ref 11.6–15.4)
WBC: 4.8 10*3/uL (ref 3.4–10.8)

## 2020-11-22 LAB — RPR: RPR Ser Ql: NONREACTIVE

## 2020-11-22 LAB — HIV ANTIBODY (ROUTINE TESTING W REFLEX): HIV Screen 4th Generation wRfx: NONREACTIVE

## 2020-11-23 ENCOUNTER — Telehealth: Payer: Self-pay

## 2020-11-23 NOTE — Telephone Encounter (Signed)
Patient name and DOB has been verified Patient was informed of lab results. Patient had no questions.  

## 2020-11-23 NOTE — Telephone Encounter (Signed)
-----   Message from Charlott Rakes, MD sent at 11/22/2020  4:41 PM EST ----- STD tests are negative, other labs are stable

## 2021-10-20 ENCOUNTER — Ambulatory Visit: Payer: Self-pay | Admitting: Podiatry

## 2022-01-04 ENCOUNTER — Other Ambulatory Visit: Payer: Self-pay

## 2022-01-04 ENCOUNTER — Encounter: Payer: Self-pay | Admitting: Physician Assistant

## 2022-01-04 ENCOUNTER — Ambulatory Visit: Payer: Self-pay | Attending: Physician Assistant | Admitting: Physician Assistant

## 2022-01-04 ENCOUNTER — Other Ambulatory Visit (HOSPITAL_COMMUNITY)
Admission: RE | Admit: 2022-01-04 | Discharge: 2022-01-04 | Disposition: A | Discharge status: Home / Self Care | Payer: No Typology Code available for payment source | Source: Ambulatory Visit | Attending: Physician Assistant | Admitting: Physician Assistant

## 2022-01-04 VITALS — BP 125/74 | HR 55 | Resp 18 | Ht 69.0 in | Wt 203.2 lb

## 2022-01-04 DIAGNOSIS — L309 Dermatitis, unspecified: Secondary | ICD-10-CM

## 2022-01-04 DIAGNOSIS — R2233 Localized swelling, mass and lump, upper limb, bilateral: Secondary | ICD-10-CM

## 2022-01-04 DIAGNOSIS — Z113 Encounter for screening for infections with a predominantly sexual mode of transmission: Secondary | ICD-10-CM | POA: Insufficient documentation

## 2022-01-04 DIAGNOSIS — M25541 Pain in joints of right hand: Secondary | ICD-10-CM

## 2022-01-04 DIAGNOSIS — M25542 Pain in joints of left hand: Secondary | ICD-10-CM

## 2022-01-04 DIAGNOSIS — Z13220 Encounter for screening for lipoid disorders: Secondary | ICD-10-CM

## 2022-01-04 MED ORDER — BETAMETHASONE DIPROPIONATE 0.05 % EX OINT: TOPICAL_OINTMENT | Freq: Two times a day (BID) | CUTANEOUS | 1 refills | Status: DC

## 2022-01-04 MED ORDER — DICLOFENAC SODIUM 75 MG PO TBEC: 75.0000 mg | DELAYED_RELEASE_TABLET | Freq: Two times a day (BID) | ORAL | 0 refills | Status: DC
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
## 2022-01-05 ENCOUNTER — Ambulatory Visit (INDEPENDENT_AMBULATORY_CARE_PROVIDER_SITE_OTHER): Payer: No Typology Code available for payment source | Admitting: Podiatry

## 2022-01-05 DIAGNOSIS — M7752 Other enthesopathy of left foot: Secondary | ICD-10-CM

## 2022-01-05 DIAGNOSIS — M7751 Other enthesopathy of right foot: Secondary | ICD-10-CM

## 2022-01-05 LAB — URINE CYTOLOGY ANCILLARY ONLY
Chlamydia: NEGATIVE
Comment: NEGATIVE
Comment: NEGATIVE
Comment: NORMAL
Neisseria Gonorrhea: NEGATIVE
Trichomonas: POSITIVE — AB

## 2022-01-05 MED ORDER — TRIAMCINOLONE ACETONIDE 10 MG/ML IJ SUSP
20.0000 mg | Freq: Once | INTRAMUSCULAR | Status: AC
  Administered 2022-01-05: 20 mg

## 2022-01-07 NOTE — Progress Notes (Signed)
Subjective:  ? ?Patient ID: Trevor Bean, male   DOB: 56 y.o.   MRN: [id number]  ? ?HPI ?Patient presents with inflammation between the third and fourth digits right and the fourth interspace left around the fourth metatarsal head.  History of structural abnormality history of digital deformities ? ? ?ROS ? ? ?   ?Objective:  ?Physical Exam  ?Neurovascular status intact with inflammation of the inner phalangeal joint digit 3 right lateral side keratotic lesion formation and inflammatory changes fourth MPJ left foot ? ?   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 ?   ?Plan:  ?H&P educated him on both conditions and went ahead today did sterile prep and injected the inner phalangeal joint digit 3 right 1 mg dexamethasone 1 mg Kenalog debrided lesions applied padding to take pressure off the toes and for the left fourth MPJ injected with 2 mg dexamethasone 1 mg Kenalog and advised on rigid bottom shoes.  Reappoint as needed ?   ? ? ?

## 2022-01-08 ENCOUNTER — Other Ambulatory Visit: Payer: Self-pay | Admitting: Physician Assistant

## 2022-01-08 DIAGNOSIS — A599 Trichomoniasis, unspecified: Secondary | ICD-10-CM

## 2022-01-08 DIAGNOSIS — M25541 Pain in joints of right hand: Secondary | ICD-10-CM

## 2022-01-08 DIAGNOSIS — E785 Hyperlipidemia, unspecified: Secondary | ICD-10-CM

## 2022-01-08 DIAGNOSIS — R2233 Localized swelling, mass and lump, upper limb, bilateral: Secondary | ICD-10-CM

## 2022-01-08 MED ORDER — METRONIDAZOLE 500 MG PO TABS: 2000.0000 mg | ORAL_TABLET | Freq: Once | ORAL | 0 refills | Status: AC

## 2022-01-08 MED ORDER — ATORVASTATIN CALCIUM 20 MG PO TABS: 20.0000 mg | ORAL_TABLET | Freq: Every day | ORAL | 3 refills | Status: DC

## 2022-01-09 LAB — COMPREHENSIVE METABOLIC PANEL
ALT: 21 IU/L (ref 0–44)
AST: 23 IU/L (ref 0–40)
Albumin/Globulin Ratio: 1.5 (ref 1.2–2.2)
Albumin: 4.2 g/dL (ref 3.8–4.9)
Alkaline Phosphatase: 61 IU/L (ref 44–121)
BUN/Creatinine Ratio: 13 (ref 9–20)
BUN: 12 mg/dL (ref 6–24)
Bilirubin Total: 0.3 mg/dL (ref 0.0–1.2)
CO2: 23 mmol/L (ref 20–29)
Calcium: 9.1 mg/dL (ref 8.7–10.2)
Chloride: 105 mmol/L (ref 96–106)
Creatinine, Ser: 0.9 mg/dL (ref 0.76–1.27)
Globulin, Total: 2.8 g/dL (ref 1.5–4.5)
Glucose: 77 mg/dL (ref 70–99)
Potassium: 3.9 mmol/L (ref 3.5–5.2)
Sodium: 141 mmol/L (ref 134–144)
Total Protein: 7 g/dL (ref 6.0–8.5)
eGFR: 101 mL/min/{1.73_m2} (ref >59)

## 2022-01-09 LAB — LIPID PANEL
Chol/HDL Ratio: 4.5 ratio (ref 0.0–5.0)
Cholesterol, Total: 224 mg/dL — ABNORMAL HIGH (ref 100–199)
HDL: 50 mg/dL (ref >39)
LDL Chol Calc (NIH): 164 mg/dL — ABNORMAL HIGH (ref 0–99)
Triglycerides: 60 mg/dL (ref 0–149)
VLDL Cholesterol Cal: 10 mg/dL (ref 5–40)

## 2022-01-09 LAB — CBC WITH DIFFERENTIAL/PLATELET
Basophils Absolute: 0 10*3/uL (ref 0.0–0.2)
Basos: 0 %
EOS (ABSOLUTE): 0.4 10*3/uL (ref 0.0–0.4)
Eos: 8 %
Hematocrit: 37.7 % (ref 37.5–51.0)
Hemoglobin: 12 g/dL — ABNORMAL LOW (ref 13.0–17.7)
Immature Grans (Abs): 0 10*3/uL (ref 0.0–0.1)
Immature Granulocytes: 0 %
Lymphocytes Absolute: 2.3 10*3/uL (ref 0.7–3.1)
Lymphs: 51 %
MCH: 23.7 pg — ABNORMAL LOW (ref 26.6–33.0)
MCHC: 31.8 g/dL (ref 31.5–35.7)
MCV: 74 fL — ABNORMAL LOW (ref 79–97)
Monocytes Absolute: 0.4 10*3/uL (ref 0.1–0.9)
Monocytes: 9 %
Neutrophils Absolute: 1.4 10*3/uL (ref 1.4–7.0)
Neutrophils: 32 %
Platelets: 201 10*3/uL (ref 150–450)
RBC: 5.07 x10E6/uL (ref 4.14–5.80)
RDW: 14.8 % (ref 11.6–15.4)
WBC: 4.5 10*3/uL (ref 3.4–10.8)

## 2022-01-09 LAB — T PALLIDUM ANTIBODY, EIA: T pallidum Antibody, EIA: NEGATIVE

## 2022-01-09 LAB — ANA W/REFLEX: ANA Titer 1: NEGATIVE

## 2022-01-09 LAB — RPR W/REFLEX TO TREPSURE: RPR: NONREACTIVE

## 2022-01-09 LAB — SEDIMENTATION RATE: Sed Rate: 49 mm/hr — ABNORMAL HIGH (ref 0–30)

## 2022-01-09 LAB — HIV ANTIBODY (ROUTINE TESTING W REFLEX): HIV Screen 4th Generation wRfx: NONREACTIVE

## 2022-01-09 LAB — RHEUMATOID FACTOR: Rheumatoid fact SerPl-aCnc: <10 IU/mL (ref <14.0)

## 2022-01-12 ENCOUNTER — Encounter: Payer: Self-pay | Admitting: Podiatrist

## 2022-01-12 ENCOUNTER — Telehealth: Payer: Self-pay | Admitting: Family Medicine

## 2022-01-12 ENCOUNTER — Ambulatory Visit (INDEPENDENT_AMBULATORY_CARE_PROVIDER_SITE_OTHER): Payer: No Typology Code available for payment source | Admitting: Podiatrist

## 2022-01-12 ENCOUNTER — Other Ambulatory Visit: Payer: Self-pay

## 2022-01-12 DIAGNOSIS — L0889 Other specified local infections of the skin and subcutaneous tissue: Secondary | ICD-10-CM | POA: Diagnosis not present

## 2022-01-12 DIAGNOSIS — L84 Corns and callosities: Secondary | ICD-10-CM | POA: Diagnosis not present

## 2022-01-12 DIAGNOSIS — D2120 Benign neoplasm of connective and other soft tissue of unspecified lower limb, including hip: Secondary | ICD-10-CM

## 2022-01-12 MED ORDER — CLINDAMYCIN PHOS-BENZOYL PEROX 1.2-5 % EX GEL: 1.0000 "application " | Freq: Two times a day (BID) | CUTANEOUS | 0 refills | Status: DC

## 2022-01-12 MED ORDER — KETOCONAZOLE 2 % EX CREA
TOPICAL_CREAM | CUTANEOUS | 2 refills | Status: DC
Start: 2022-01-12 — End: 2022-05-31

## 2022-01-12 NOTE — Telephone Encounter (Signed)
Spoke to Pharmacy Tech at [facility]  ?States his total medications came up to 450.00 ? ?Atorvastatin is 158.00 ? ?Possibly could be that they are not a preferred pharmacy for insurance company and may need to change pharmacies or he has not met deductible.  ?Patient would need to call insurance company. ? ?Called patient back to let him know. He states he was on the phone with his insurance company.  ? ?

## 2022-01-12 NOTE — Patient Instructions (Signed)
Foot Exfoliation Instructions:  1)  Get a tub of warm water (about 10 cups of water or enough water to cover the bottom of your foot)  and place 1/2 cup Epsom salts in the water ( water will be a little cloudy) Soak for 15-20  minutes.  2)  remove your foot from the soak and scrub the feet with Dr. Teals epsom salt exfoliant scrub ([facility])   3)  Next use a pumice stone or a pedicure file to scrub down the rough areas of skin on your feet-  do this while your feet are still wet.  You may also prefer to go into the shower and do this while showering.  (Every other day at first , then once weekly for maintenance)  4)  cleanse all of the scrub off of your feet and dry the feet well.    5)  Apply Flexitol Heel balm or O'keefs Healthy feet foot balm after this procedure-- may also apply to feet daily or twice daily.  ** Once a week you may use Dr. Scholls Ultra exfoliating foot mask or similar ([facility], [facility], etc) prior to soaking as well.      Shopping list:  Epsom salts   Pumice stone or pedicure file   Flexitol Heel balm or O'keefs healthy feet foot balm    Optional-  Dr. Schools Ultra exfoliating foot mask          

## 2022-01-12 NOTE — Telephone Encounter (Signed)
Patient states cholesterol medication cost to much and would like PCP to prescribe another medication. Patient would like a follow up call when completed.  ? ? ?[id number] [place] [address] [place]  [phone] ?Fax:  [phone] ?  ? ?

## 2022-01-12 NOTE — Progress Notes (Signed)
? ? ?HPI: Patient is 56 y.o. male who presents today for pain in both feet due to calluses on both heels as well as a corn between the right second and third digit as well as a corn on the left fifth digit.  He also has a lesion submetatarsal 4 of the right foot which is uncomfortable.  ? ?Patient Active Problem List  ? Diagnosis Date Noted  ? Pain in right hand 09/11/2019  ? Ganglion of flexor tendon sheath of right ring finger 01/27/2018  ? ? ?Current Outpatient Medications on File Prior to Visit  ?Medication Sig Dispense Refill  ? atorvastatin (LIPITOR) 20 MG tablet Take 1 tablet (20 mg total) by mouth daily. 90 tablet 3  ? betamethasone dipropionate (DIPROLENE) 0.05 % ointment Apply topically 2 (two) times daily. 45 g 1  ? diclofenac (VOLTAREN) 75 MG EC tablet Take 1 tablet (75 mg total) by mouth 2 (two) times daily. Prn joint pain 60 tablet 0  ? ?No current facility-administered medications on file prior to visit.  ? ? ?Allergies  ?Allergen Reactions  ? Shellfish Allergy   ?  Swelling / doesn't happen all the time  ? Corn-Containing Products Other (See Comments)  ?  Gout flare up  ? ? ?Review of Systems ?No fevers, chills, nausea, muscle aches, no difficulty breathing, no calf pain, no chest pain or shortness of breath. ? ? ?Physical Exam ? ?GENERAL APPEARANCE: Alert, conversant. Appropriately groomed. No acute distress.  ? ?VASCULAR: Pedal pulses palpable DP and PT bilateral.  Capillary refill time is immediate to all digits,  Proximal to distal cooling it warm to warm.  Digital perfusion adequate.  ? ?NEUROLOGIC: sensation is intact to 5.07 monofilament at 5/5 sites bilateral.  Light touch is intact bilateral, vibratory sensation intact bilateral ? ?MUSCULOSKELETAL: acceptable muscle strength, tone and stability bilateral.  No gross boney pedal deformities noted.  No pain, crepitus or limitation noted with foot and ankle range of motion bilateral.  Mild adductovarus rotation of the left fifth digit is  noted. ? ?DERMATOLOGIC: skin is warm, supple, and dry.  Color, texture, and turgor of skin within normal limits.  No open wounds are noted.  A hyperkeratotic lesion is noted on the lateral aspect of the right second digit at the proximal interphalangeal joint which is painful and symptomatic with pressure.  Hyperkeratotic lesion is also present on the plantar medial aspect of the left fifth digit and interdigital space with some interdigital maceration in this area noted as well.  Hyperkeratotic lesion with suspicious splinter is noted on the plantar aspect of the right foot submetatarsal 4.  notable areas of pitting are noted on the plantar aspect of bilateral feet.  All areas are painful and symptomatic.  He also has significant skin thickening of the posterior aspect of bilateral heels ? ? ? ?Assessment  ? ?  ICD-10-CM   ?1. Callus  L84   ?  ?2. Corn of toe  L84   ?  ?3. 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  ?4. Benign neoplasm of soft tissue of lower extremity, unspecified laterality  D21.20   ?  ? ? ? ? ?Plan ? ?Discussed exam findings and treatment plans and recommendations.  I recommended paring down all of the hyperkeratotic areas was was which was accomplished today after soaking in 3 wea.  Patient tolerated this well and no iatrogenic bleeding was noted.  Recommendations on aftercare were dispensed.  I also wrote a prescription for ketoconazole to put  between the fourth and fifth toes of the left foot as well as clindamycin/benzyl peroxide to apply to the plantar aspect of both feet to try and help with the pitted keratolysis.  Will be seen back as needed for follow-up and will call if any concerns or questions arise in the future. ?

## 2022-01-12 NOTE — Telephone Encounter (Signed)
Atorvastatin is an oral generic medication that is usually pretty cheap. Pt will need to call his insurance regarding this.  ?

## 2022-01-22 ENCOUNTER — Telehealth: Payer: Self-pay | Admitting: Family Medicine

## 2022-01-22 NOTE — Telephone Encounter (Signed)
Medication Refill - Medication: metroNIDAZOLE (FLAGYL) 500 MG tablet ?4 tabs ?Has the patient contacted their pharmacy? No. ?Pt states he lost those 4 tabs some kind of way from the pharmacy to his house. ?He would like to know if you can resend? ? ?Preferred Pharmacy (with phone number or street name): [id number] [place]?Has the patient been seen for an appointment in the last year OR does the patient have an upcoming appointment? Yes.   ? ?Agent: Please be advised that RX refills may take up to 3 business days. We ask that you follow-up with your pharmacy. ? ?

## 2022-01-23 MED ORDER — METRONIDAZOLE 500 MG PO TABS: 2000.0000 mg | ORAL_TABLET | Freq: Once | ORAL | 0 refills | Status: AC

## 2022-01-23 NOTE — Telephone Encounter (Signed)
Patient called to discuss refill request, no answer, VM is full. Routing to provider for approval of additional medication. Pt states he lost those 4 tabs some kind of way from the pharmacy to his house. ?He would like to know if you can resend? ? ?Argentina Donovan, PA-C  ?01/08/2022 10:10 AM EDT   ?  ?Please call patient.  He is positive for trichomonas which is an STD.  I have sent him a prescription of metronidazole to take(it is 4 pills he should take all at once).  He should alert any partners.  He should not have sex for 1 week after treatment.  Condoms can help prevent these infections.  He was negative for gonorrhea, HIV, and chlamydia. His cholesterol is also high and he needs to be on cholesterol medication.  I have sent a prescription of atorvastatin for this.  Drink plenty of water and eat a heart healthy diet.  One of his inflammatory markers is elevated so I am referring him to rheumatology for the hand pain.  Blood count is stable. Kidney function, liver function, and blood sugar are normal. I am still awaiting syphilis and lupus testing results.  Follow up as planned.  Thanks, Freeman Caldron, PA-C  ? ?

## 2022-01-23 NOTE — Addendum Note (Signed)
Addended byCharlott Rakes on: 01/23/2022 01:13 PM ? ? Modules accepted: Orders ? ?

## 2022-01-23 NOTE — Telephone Encounter (Signed)
Done

## 2022-01-31 ENCOUNTER — Ambulatory Visit (HOSPITAL_COMMUNITY)
Admission: EM | Admit: 2022-01-31 | Discharge: 2022-01-31 | Disposition: A | Discharge status: Home / Self Care | Payer: 59

## 2022-01-31 ENCOUNTER — Encounter (HOSPITAL_COMMUNITY): Payer: Self-pay | Admitting: *Deleted

## 2022-01-31 ENCOUNTER — Other Ambulatory Visit: Payer: Self-pay

## 2022-01-31 DIAGNOSIS — L84 Corns and callosities: Secondary | ICD-10-CM

## 2022-01-31 NOTE — Discharge Instructions (Signed)
As we discussed, you should pick up the cream that you are prescribed by the podiatrist.  Give them a call to schedule another appointment given that you are still having some pain.  For the area that is causing discomfort, can try corn pads that you can get over-the-counter to see if this will help with cushioning. ?

## 2022-01-31 NOTE — ED Triage Notes (Signed)
Pt reports he has corns between toes. ?
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
## 2022-02-05 ENCOUNTER — Telehealth: Payer: Self-pay | Admitting: Oncology

## 2022-02-05 NOTE — Telephone Encounter (Signed)
Attempted to contact patient to schedule initial visit from referral. No answer and voicemail was full so unable to leave voicemail. ?

## 2022-02-06 ENCOUNTER — Ambulatory Visit: Payer: No Typology Code available for payment source | Admitting: Podiatrist

## 2022-02-19 ENCOUNTER — Ambulatory Visit (INDEPENDENT_AMBULATORY_CARE_PROVIDER_SITE_OTHER): Payer: No Typology Code available for payment source | Admitting: Podiatrist

## 2022-02-19 ENCOUNTER — Encounter: Payer: Self-pay | Admitting: Podiatrist

## 2022-02-19 DIAGNOSIS — L84 Corns and callosities: Secondary | ICD-10-CM

## 2022-02-19 DIAGNOSIS — B353 Tinea pedis: Secondary | ICD-10-CM

## 2022-02-19 NOTE — Progress Notes (Signed)
Chief Complaint  ?Patient presents with  ? Callouses  ?  Corns between his toes are bothering him   ?  ? ?HPI: Patient is 56 y.o. male who presents today for a painful area on the medial aspect of the left fifth toe as well as a painful area on the lateral aspect of the right third toe he relates pain with ambulation and in shoe gear.  States has had these areas cut out in the past however they have returned.  He also states he has not been able to get the antifungal cream yet but he is going to get it today. ? ? ?Allergies  ?Allergen Reactions  ? Shellfish Allergy   ?  Swelling / doesn't happen all the time  ? Corn-Containing Products Other (See Comments)  ?  Gout flare up  ? ? ?Review of systems is negative except as noted in the HPI.  Denies nausea/ vomiting/ fevers/ chills or night sweats.   Denies difficulty breathing, denies calf pain or tenderness ? ?Physical Exam ? ?Patient is awake, alert, and oriented x 3.  In no acute distress.   ? ?Vascular status is intact with palpable pedal pulses DP and PT bilateral and capillary refill time less than 3 seconds bilateral.  No edema or erythema noted.  ? ?Neurological exam reveals epicritic and protective sensation grossly intact bilateral.  ? ?Dermatological exam reveals well-circumscribed hyperkeratotic lesion on the medial aspect of the left fourth digit with interdigital maceration present as well in this area.  Interdigital maceration present in all interspaces of the left foot. ?Right foot lateral aspect of the right third toe has a well-circumscribed porokeratotic lesion present. ? ?Musculoskeletal exam: Mild hammertoe contracture present bilateral ? ? ? ?Assessment: ?  ICD-10-CM   ?1. Corn of toe  L84   ?  ?2. Tinea pedis of left foot  B35.3   ?  ? ? ? ? ? ?Plan: ?Recommended paring the lesions and this was accomplished today with a #15 blade.  I did excise the cord on the right third toe and there was bleeding after-I applied antibiotic and a dry sterile and  compressive dressing and recommended he watch the area carefully for signs of infection.  He will be seen back as needed for follow-up if any problems or concerns arise he instructed to call immediately. ? ?

## 2022-02-22 ENCOUNTER — Other Ambulatory Visit: Payer: Self-pay | Admitting: *Deleted

## 2022-02-22 DIAGNOSIS — D509 Iron deficiency anemia, unspecified: Secondary | ICD-10-CM

## 2022-02-22 NOTE — Progress Notes (Signed)
Lab orders entered for new pt appt ? ?

## 2022-02-27 ENCOUNTER — Other Ambulatory Visit: Payer: Self-pay | Admitting: Nurse Practitioner

## 2022-02-27 DIAGNOSIS — D509 Iron deficiency anemia, unspecified: Secondary | ICD-10-CM
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
## 2022-02-28 ENCOUNTER — Inpatient Hospital Stay: Payer: 59 | Admitting: Nurse Practitioner

## 2022-02-28 ENCOUNTER — Inpatient Hospital Stay: Payer: 59

## 2022-03-01 ENCOUNTER — Ambulatory Visit (INDEPENDENT_AMBULATORY_CARE_PROVIDER_SITE_OTHER): Payer: Self-pay | Admitting: Sports Medicine

## 2022-03-01 DIAGNOSIS — Z91199 Patient's noncompliance with other medical treatment and regimen due to unspecified reason: Secondary | ICD-10-CM

## 2022-03-01 NOTE — Progress Notes (Signed)
Patient was a no-show for today's visit 03/01/2022.  No-show charges filed. ?

## 2022-03-27 ENCOUNTER — Other Ambulatory Visit (HOSPITAL_BASED_OUTPATIENT_CLINIC_OR_DEPARTMENT_OTHER): Payer: Self-pay

## 2022-03-27 ENCOUNTER — Encounter: Payer: Self-pay | Admitting: Nurse Practitioner

## 2022-03-27 ENCOUNTER — Inpatient Hospital Stay (HOSPITAL_BASED_OUTPATIENT_CLINIC_OR_DEPARTMENT_OTHER): Payer: No Typology Code available for payment source | Admitting: Nurse Practitioner

## 2022-03-27 ENCOUNTER — Inpatient Hospital Stay: Payer: No Typology Code available for payment source | Attending: Oncology

## 2022-03-27 VITALS — BP 116/76 | HR 60 | Temp 98.2°F | Resp 18 | Ht 69.0 in | Wt 207.2 lb

## 2022-03-27 DIAGNOSIS — D509 Iron deficiency anemia, unspecified: Secondary | ICD-10-CM

## 2022-03-27 DIAGNOSIS — D573 Sickle-cell trait: Secondary | ICD-10-CM

## 2022-03-27 DIAGNOSIS — Z803 Family history of malignant neoplasm of breast: Secondary | ICD-10-CM | POA: Diagnosis not present

## 2022-03-27 DIAGNOSIS — D709 Neutropenia, unspecified: Secondary | ICD-10-CM

## 2022-03-27 DIAGNOSIS — Z8041 Family history of malignant neoplasm of ovary: Secondary | ICD-10-CM

## 2022-03-27 DIAGNOSIS — D649 Anemia, unspecified: Secondary | ICD-10-CM

## 2022-03-27 LAB — CBC WITH DIFFERENTIAL (CANCER CENTER ONLY)
Abs Immature Granulocytes: 0.01 10*3/uL (ref 0.00–0.07)
Basophils Absolute: 0 10*3/uL (ref 0.0–0.1)
Basophils Relative: 0 %
Eosinophils Absolute: 0.3 10*3/uL (ref 0.0–0.5)
Eosinophils Relative: 6 %
HCT: 34.2 % — ABNORMAL LOW (ref 39.0–52.0)
Hemoglobin: 11.2 g/dL — ABNORMAL LOW (ref 13.0–17.0)
Immature Granulocytes: 0 %
Lymphocytes Relative: 49 %
Lymphs Abs: 2.2 10*3/uL (ref 0.7–4.0)
MCH: 23.7 pg — ABNORMAL LOW (ref 26.0–34.0)
MCHC: 32.7 g/dL (ref 30.0–36.0)
MCV: 72.5 fL — ABNORMAL LOW (ref 80.0–100.0)
Monocytes Absolute: 0.6 10*3/uL (ref 0.1–1.0)
Monocytes Relative: 12 %
Neutro Abs: 1.5 10*3/uL — ABNORMAL LOW (ref 1.7–7.7)
Neutrophils Relative %: 33 %
Platelet Count: 203 10*3/uL (ref 150–400)
RBC: 4.72 MIL/uL (ref 4.22–5.81)
RDW: 13.6 % (ref 11.5–15.5)
WBC Count: 4.5 10*3/uL (ref 4.0–10.5)
nRBC: 0 % (ref 0.0–0.2)

## 2022-03-27 LAB — SAVE SMEAR(SSMR), FOR PROVIDER SLIDE REVIEW

## 2022-03-27 LAB — FERRITIN: Ferritin: 109 ng/mL (ref 24–336)
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
## 2022-03-29 ENCOUNTER — Telehealth: Payer: Self-pay | Admitting: Genetic Counselor

## 2022-03-29 LAB — HGB FRACTIONATION CASCADE
Hgb A2: 2.9 % (ref 1.8–3.2)
Hgb A: 67.3 % — ABNORMAL LOW (ref 96.4–98.8)
Hgb F: 0 % (ref 0.0–2.0)
Hgb S: 29.8 % — ABNORMAL HIGH

## 2022-03-29 LAB — HGB SOLUBILITY: Hgb Solubility: POSITIVE — AB

## 2022-03-29 NOTE — Telephone Encounter (Signed)
Scheduled appt per 6/6 referral. Pt is aware of appt date and time. Pt is aware to arrive 15 mins prior to appt time and to bring and updated insurance card. Pt is aware of appt location.

## 2022-04-05 LAB — ALPHA-THALASSEMIA GENOTYPR

## 2022-04-17 ENCOUNTER — Telehealth: Payer: Self-pay

## 2022-04-17 ENCOUNTER — Other Ambulatory Visit: Payer: Self-pay | Admitting: Genetic Counselor

## 2022-04-17 ENCOUNTER — Encounter: Payer: Self-pay | Admitting: Family Medicine

## 2022-04-17 ENCOUNTER — Other Ambulatory Visit (HOSPITAL_COMMUNITY)
Admission: RE | Admit: 2022-04-17 | Discharge: 2022-04-17 | Disposition: A | Discharge status: Home / Self Care | Payer: No Typology Code available for payment source | Source: Ambulatory Visit | Attending: Family Medicine | Admitting: Family Medicine

## 2022-04-17 ENCOUNTER — Ambulatory Visit: Payer: 59 | Attending: Family Medicine | Admitting: Family Medicine

## 2022-04-17 VITALS — BP 112/69 | HR 59 | Temp 98.2°F | Ht 69.0 in | Wt 206.2 lb

## 2022-04-17 DIAGNOSIS — A599 Trichomoniasis, unspecified: Secondary | ICD-10-CM

## 2022-04-17 DIAGNOSIS — Z803 Family history of malignant neoplasm of breast: Secondary | ICD-10-CM

## 2022-04-17 DIAGNOSIS — Z113 Encounter for screening for infections with a predominantly sexual mode of transmission: Secondary | ICD-10-CM | POA: Insufficient documentation

## 2022-04-17 DIAGNOSIS — D649 Anemia, unspecified: Secondary | ICD-10-CM

## 2022-04-17 DIAGNOSIS — D573 Sickle-cell trait: Secondary | ICD-10-CM

## 2022-04-17 DIAGNOSIS — Z1159 Encounter for screening for other viral diseases: Secondary | ICD-10-CM | POA: Diagnosis not present

## 2022-04-17 NOTE — Telephone Encounter (Signed)
Patient called in stated he need a copy of his last lab and progress note. I advice the patient  I can mail the information to him. I mailed out the information to the patient. Patient gave verbal understanding and had no further questions or concerns.

## 2022-04-17 NOTE — Progress Notes (Signed)
 Wants STD testing.

## 2022-04-18 LAB — RPR W/REFLEX TO TREPSURE: RPR: NONREACTIVE

## 2022-04-18 LAB — HIV ANTIBODY (ROUTINE TESTING W REFLEX): HIV Screen 4th Generation wRfx: NONREACTIVE

## 2022-04-18 LAB — HCV INTERPRETATION

## 2022-04-18 LAB — HCV AB W REFLEX TO QUANT PCR: HCV Ab: NONREACTIVE

## 2022-04-18 LAB — T PALLIDUM ANTIBODY, EIA: T pallidum Antibody, EIA: NEGATIVE

## 2022-04-19 LAB — URINE CYTOLOGY ANCILLARY ONLY
Bacterial Vaginitis-Urine: NEGATIVE
Candida Urine: NEGATIVE
Chlamydia: NEGATIVE
Comment: NEGATIVE
Comment: NEGATIVE
Comment: NORMAL
Neisseria Gonorrhea: NEGATIVE
Trichomonas: NEGATIVE

## 2022-04-26 ENCOUNTER — Telehealth: Payer: Self-pay | Admitting: Genetic Counselor

## 2022-04-26 ENCOUNTER — Inpatient Hospital Stay: Payer: No Typology Code available for payment source | Attending: Nurse Practitioner

## 2022-04-26 ENCOUNTER — Inpatient Hospital Stay (HOSPITAL_BASED_OUTPATIENT_CLINIC_OR_DEPARTMENT_OTHER): Payer: No Typology Code available for payment source | Admitting: Oncology

## 2022-04-26 VITALS — BP 138/74 | HR 60 | Temp 98.1°F | Resp 18 | Ht 69.0 in | Wt 205.4 lb

## 2022-04-26 DIAGNOSIS — D509 Iron deficiency anemia, unspecified: Secondary | ICD-10-CM | POA: Diagnosis not present

## 2022-04-26 DIAGNOSIS — Z803 Family history of malignant neoplasm of breast: Secondary | ICD-10-CM

## 2022-04-26 LAB — CBC WITH DIFFERENTIAL (CANCER CENTER ONLY)
Abs Immature Granulocytes: 0.01 10*3/uL (ref 0.00–0.07)
Basophils Absolute: 0 10*3/uL (ref 0.0–0.1)
Basophils Relative: 0 %
Eosinophils Absolute: 0.3 10*3/uL (ref 0.0–0.5)
Eosinophils Relative: 7 %
HCT: 36.6 % — ABNORMAL LOW (ref 39.0–52.0)
Hemoglobin: 11.9 g/dL — ABNORMAL LOW (ref 13.0–17.0)
Immature Granulocytes: 0 %
Lymphocytes Relative: 44 %
Lymphs Abs: 2 10*3/uL (ref 0.7–4.0)
MCH: 23.8 pg — ABNORMAL LOW (ref 26.0–34.0)
MCHC: 32.5 g/dL (ref 30.0–36.0)
MCV: 73.3 fL — ABNORMAL LOW (ref 80.0–100.0)
Monocytes Absolute: 0.5 10*3/uL (ref 0.1–1.0)
Monocytes Relative: 10 %
Neutro Abs: 1.8 10*3/uL (ref 1.7–7.7)
Neutrophils Relative %: 39 %
Platelet Count: 188 10*3/uL (ref 150–400)
RBC: 4.99 MIL/uL (ref 4.22–5.81)
RDW: 14.2 % (ref 11.5–15.5)
WBC Count: 4.5 10*3/uL (ref 4.0–10.5)
nRBC: 0 % (ref 0.0–0.2)

## 2022-04-26 LAB — GENETIC SCREENING ORDER
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
## 2022-04-26 NOTE — Telephone Encounter (Signed)
Ambry kit drawn at [facility].  Cephus [facility]ing TRF for CancerNext-Expanded +RNAinsight Panel submitted.  Patient scheduled for genetic counseling on 7/10.

## 2022-04-30 ENCOUNTER — Other Ambulatory Visit: Payer: Self-pay

## 2022-04-30 ENCOUNTER — Encounter: Payer: Self-pay | Admitting: Genetic Counselor

## 2022-04-30 ENCOUNTER — Inpatient Hospital Stay (HOSPITAL_BASED_OUTPATIENT_CLINIC_OR_DEPARTMENT_OTHER): Payer: No Typology Code available for payment source | Admitting: Genetic Counselor

## 2022-04-30 ENCOUNTER — Other Ambulatory Visit: Payer: 59

## 2022-04-30 DIAGNOSIS — Z8042 Family history of malignant neoplasm of prostate: Secondary | ICD-10-CM

## 2022-04-30 DIAGNOSIS — Z8041 Family history of malignant neoplasm of ovary: Secondary | ICD-10-CM | POA: Diagnosis not present

## 2022-04-30 DIAGNOSIS — Z803 Family history of malignant neoplasm of breast: Secondary | ICD-10-CM
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
## 2022-05-15 ENCOUNTER — Telehealth: Payer: Self-pay | Admitting: Genetic Counselor

## 2022-05-15 ENCOUNTER — Encounter: Payer: Self-pay | Admitting: Genetic Counselor

## 2022-05-15 DIAGNOSIS — Z1379 Encounter for other screening for genetic and chromosomal anomalies: Secondary | ICD-10-CM | POA: Insufficient documentation

## 2022-05-15 NOTE — Telephone Encounter (Signed)
Mailbox is full and cannot accept messages.  Will send a message through [facility].
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
## 2022-05-22 ENCOUNTER — Ambulatory Visit: Payer: Self-pay

## 2022-05-22 ENCOUNTER — Ambulatory Visit (INDEPENDENT_AMBULATORY_CARE_PROVIDER_SITE_OTHER): Payer: No Typology Code available for payment source | Admitting: Family Medicine

## 2022-05-22 VITALS — BP 121/72 | HR 58 | Ht 68.0 in | Wt 205.0 lb

## 2022-05-22 DIAGNOSIS — M25512 Pain in left shoulder: Secondary | ICD-10-CM

## 2022-05-22 NOTE — Patient Instructions (Signed)
You have a pectoralis major tendon tear. Unfortunately with how long ago this happened it's not fixable at this point. Out of work until Monday. Do home exercises every other day to build as much strength back into this as you can. Icing 15 minutes at a time as needed. Let me know if you want to do physical therapy and we can put in an order for this. Tylenol, aleve as needed. Consider work restrictions. Follow up with me in 1 month or as needed.

## 2022-05-23 ENCOUNTER — Encounter: Payer: Self-pay | Admitting: Family Medicine
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
## 2022-05-31 ENCOUNTER — Encounter: Payer: Self-pay | Admitting: Rheumatology

## 2022-05-31 ENCOUNTER — Ambulatory Visit (INDEPENDENT_AMBULATORY_CARE_PROVIDER_SITE_OTHER): Payer: No Typology Code available for payment source

## 2022-05-31 ENCOUNTER — Ambulatory Visit: Payer: No Typology Code available for payment source | Attending: Rheumatology | Admitting: Rheumatology

## 2022-05-31 VITALS — BP 130/85 | HR 52 | Resp 17 | Ht 68.0 in | Wt 204.6 lb

## 2022-05-31 DIAGNOSIS — M542 Cervicalgia: Secondary | ICD-10-CM

## 2022-05-31 DIAGNOSIS — G8929 Other chronic pain: Secondary | ICD-10-CM

## 2022-05-31 DIAGNOSIS — M7061 Trochanteric bursitis, right hip: Secondary | ICD-10-CM | POA: Diagnosis not present

## 2022-05-31 DIAGNOSIS — M79642 Pain in left hand: Secondary | ICD-10-CM

## 2022-05-31 DIAGNOSIS — Z8041 Family history of malignant neoplasm of ovary: Secondary | ICD-10-CM

## 2022-05-31 DIAGNOSIS — M19072 Primary osteoarthritis, left ankle and foot: Secondary | ICD-10-CM

## 2022-05-31 DIAGNOSIS — M79641 Pain in right hand: Secondary | ICD-10-CM | POA: Diagnosis not present

## 2022-05-31 DIAGNOSIS — M67441 Ganglion, right hand: Secondary | ICD-10-CM

## 2022-05-31 DIAGNOSIS — Z8042 Family history of malignant neoplasm of prostate: Secondary | ICD-10-CM

## 2022-05-31 DIAGNOSIS — M25512 Pain in left shoulder: Secondary | ICD-10-CM | POA: Diagnosis not present

## 2022-05-31 DIAGNOSIS — M25561 Pain in right knee: Secondary | ICD-10-CM

## 2022-05-31 DIAGNOSIS — M19071 Primary osteoarthritis, right ankle and foot: Secondary | ICD-10-CM

## 2022-05-31 DIAGNOSIS — M25562 Pain in left knee: Secondary | ICD-10-CM

## 2022-05-31 DIAGNOSIS — E785 Hyperlipidemia, unspecified: Secondary | ICD-10-CM

## 2022-05-31 DIAGNOSIS — R21 Rash and other nonspecific skin eruption: Secondary | ICD-10-CM

## 2022-05-31 DIAGNOSIS — M7062 Trochanteric bursitis, left hip: Secondary | ICD-10-CM

## 2022-05-31 DIAGNOSIS — R2233 Localized swelling, mass and lump, upper limb, bilateral: Secondary | ICD-10-CM

## 2022-05-31 DIAGNOSIS — Z803 Family history of malignant neoplasm of breast: Secondary | ICD-10-CM

## 2022-05-31 NOTE — Patient Instructions (Signed)
Cervical Strain and Sprain Rehab Ask your health care provider which exercises are safe for you. Do exercises exactly as told by your health care provider and adjust them as directed. It is normal to feel mild stretching, pulling, tightness, or discomfort as you do these exercises. Stop right away if you feel sudden pain or your pain gets worse. Do not begin these exercises until told by your health care provider. Stretching and range-of-motion exercises Cervical side bending  Using good posture, sit on a stable chair or stand up. Without moving your shoulders, slowly tilt your left / right ear to your shoulder until you feel a stretch in the opposite side neck muscles. You should be looking straight ahead. Hold for __________ seconds. Repeat with the other side of your neck. Repeat __________ times. Complete this exercise __________ times a day. Cervical rotation  Using good posture, sit on a stable chair or stand up. Slowly turn your head to the side as if you are looking over your left / right shoulder. Keep your eyes level with the ground. Stop when you feel a stretch along the side and the back of your neck. Hold for __________ seconds. Repeat this by turning to your other side. Repeat __________ times. Complete this exercise __________ times a day. Thoracic extension and pectoral stretch  Roll a towel or a small blanket so it is about 4 inches (10 cm) in diameter. Lie down on your back on a firm surface. Put the towel in the middle of your back across your spine. It should not be under your shoulder blades. Put your hands behind your head and let your elbows fall out to your sides. Hold for __________ seconds. Repeat __________ times. Complete this exercise __________ times a day. Strengthening exercises Isometric upper cervical flexion  Lie on your back with a thin pillow behind your head and a small rolled-up towel under your neck. Gently tuck your chin toward your chest and  nod your head down to look toward your feet. Do not lift your head off the pillow. Hold for __________ seconds. Release the tension slowly. Relax your neck muscles completely before you repeat this exercise. Repeat __________ times. Complete this exercise __________ times a day. Isometric cervical extension  Stand about 6 inches (15 cm) away from a wall, with your back facing the wall. Place a soft object, about 6-8 inches (15-20 cm) in diameter, between the back of your head and the wall. A soft object could be a small pillow, a ball, or a folded towel. Gently tilt your head back and press into the soft object. Keep your jaw and forehead relaxed. Hold for __________ seconds. Release the tension slowly. Relax your neck muscles completely before you repeat this exercise. Repeat __________ times. Complete this exercise __________ times a day. Posture and body mechanics Body mechanics refers to the movements and positions of your body while you do your daily activities. Posture is part of body mechanics. Good posture and healthy body mechanics can help to relieve stress in your body's tissues and joints. Good posture means that your spine is in its natural S-curve position (your spine is neutral), your shoulders are pulled back slightly, and your head is not tipped forward. The following are general guidelines for applying improved posture and body mechanics to your everyday activities. Sitting  When sitting, keep your spine neutral and keep your feet flat on the floor. Use a footrest, if necessary, and keep your thighs parallel to the floor. Avoid rounding   your shoulders, and avoid tilting your head forward. When working at a desk or a computer, keep your desk at a height where your hands are slightly lower than your elbows. Slide your chair under your desk so you are close enough to maintain good posture. When working at a computer, place your monitor at a height where you are looking straight ahead  and you do not have to tilt your head forward or downward to look at the screen. Standing  When standing, keep your spine neutral and keep your feet about hip-width apart. Keep a slight bend in your knees. Your ears, shoulders, and hips should line up. When you do a task in which you stand in one place for a long time, place one foot up on a stable object that is 2-4 inches (5-10 cm) high, such as a footstool. This helps keep your spine neutral. Resting When lying down and resting, avoid positions that are most painful for you. Try to support your neck in a neutral position. You can use a contour pillow or a small rolled-up towel. Your pillow should support your neck but not push on it. This information is not intended to replace advice given to you by your health care provider. Make sure you discuss any questions you have with your health care provider. Document Revised: 08/28/2021 Document Reviewed: 08/28/2021 Elsevier Patient Education  [place]. Hand Exercises Hand exercises can be helpful for almost anyone. These exercises can strengthen the hands, improve flexibility and movement, and increase blood flow to the hands. These results can make work and daily tasks easier. Hand exercises can be especially helpful for people who have joint pain from arthritis or have nerve damage from overuse (carpal tunnel syndrome). These exercises can also help people who have injured a hand. Exercises Most of these hand exercises are gentle stretching and motion exercises. It is usually safe to do them often throughout the day. Warming up your hands before exercise may help to reduce stiffness. You can do this with gentle massage or by placing your hands in warm water for 10-15 minutes. It is normal to feel some stretching, pulling, tightness, or mild discomfort as you begin new exercises. This will gradually improve. Stop an exercise right away if you feel sudden, severe pain or your pain gets worse.  Ask your health care provider which exercises are best for you. Knuckle bend or "claw" fist  Stand or sit with your arm, hand, and all five fingers pointed straight up. Make sure to keep your wrist straight during the exercise. Gently bend your fingers down toward your palm until the tips of your fingers are touching the top of your palm. Keep your big knuckle straight and just bend the small knuckles in your fingers. Hold this position for __________ seconds. Straighten (extend) your fingers back to the starting position. Repeat this exercise 5-10 times with each hand. Full finger fist  Stand or sit with your arm, hand, and all five fingers pointed straight up. Make sure to keep your wrist straight during the exercise. Gently bend your fingers into your palm until the tips of your fingers are touching the middle of your palm. Hold this position for __________ seconds. Extend your fingers back to the starting position, stretching every joint fully. Repeat this exercise 5-10 times with each hand. Straight fist Stand or sit with your arm, hand, and all five fingers pointed straight up. Make sure to keep your wrist straight during the exercise. Gently bend  your fingers at the big knuckle, where your fingers meet your hand, and the middle knuckle. Keep the knuckle at the tips of your fingers straight and try to touch the bottom of your palm. Hold this position for __________ seconds. Extend your fingers back to the starting position, stretching every joint fully. Repeat this exercise 5-10 times with each hand. Tabletop  Stand or sit with your arm, hand, and all five fingers pointed straight up. Make sure to keep your wrist straight during the exercise. Gently bend your fingers at the big knuckle, where your fingers meet your hand, as far down as you can while keeping the small knuckles in your fingers straight. Think of forming a tabletop with your fingers. Hold this position for __________  seconds. Extend your fingers back to the starting position, stretching every joint fully. Repeat this exercise 5-10 times with each hand. Finger spread  Place your hand flat on a table with your palm facing down. Make sure your wrist stays straight as you do this exercise. Spread your fingers and thumb apart from each other as far as you can until you feel a gentle stretch. Hold this position for __________ seconds. Bring your fingers and thumb tight together again. Hold this position for __________ seconds. Repeat this exercise 5-10 times with each hand. Making circles  Stand or sit with your arm, hand, and all five fingers pointed straight up. Make sure to keep your wrist straight during the exercise. Make a circle by touching the tip of your thumb to the tip of your index finger. Hold for __________ seconds. Then open your hand wide. Repeat this motion with your thumb and each finger on your hand. Repeat this exercise 5-10 times with each hand. Thumb motion  Sit with your forearm resting on a table and your wrist straight. Your thumb should be facing up toward the ceiling. Keep your fingers relaxed as you move your thumb. Lift your thumb up as high as you can toward the ceiling. Hold for __________ seconds. Bend your thumb across your palm as far as you can, reaching the tip of your thumb for the small finger (pinkie) side of your palm. Hold for __________ seconds. Repeat this exercise 5-10 times with each hand. Grip strengthening  Hold a stress ball or other soft ball in the middle of your hand. Slowly increase the pressure, squeezing the ball as much as you can without causing pain. Think of bringing the tips of your fingers into the middle of your palm. All of your finger joints should bend when doing this exercise. Hold your squeeze for __________ seconds, then relax. Repeat this exercise 5-10 times with each hand. Contact a health care provider if: Your hand pain or discomfort  gets much worse when you do an exercise. Your hand pain or discomfort does not improve within 2 hours after you exercise. If you have any of these problems, stop doing these exercises right away. Do not do them again unless your health care provider says that you can. Get help right away if: You develop sudden, severe hand pain or swelling. If this happens, stop doing these exercises right away. Do not do them again unless your health care provider says that you can. This information is not intended to replace advice given to you by your health care provider. Make sure you discuss any questions you have with your health care provider. Document Revised: 01/26/2021 Document Reviewed: 01/26/2021 Elsevier Patient Education  [place]. Exercises for Chronic  Knee Pain Chronic knee pain is pain that lasts longer than 3 months. For most people with chronic knee pain, exercise and weight loss is an important part of treatment. Your health care provider may want you to focus on: Strengthening the muscles that support your knee. This can take pressure off your knee and lessen pain. Preventing knee stiffness. Maintaining or increasing how far you can move your knee. Losing weight (if this applies) to take pressure off your knee, decrease your risk for injury, and make it easier for you to exercise. Your health care provider will help you develop an exercise program that matches your needs and physical abilities. Below are simple, low-impact exercises you can do at home. Ask your health care provider or a physical therapist how often you should do your exercise program and how many times to repeat each exercise. General safety tips Follow these safety tips for exercising with chronic knee pain: Get your health care provider's approval before doing any exercises. Start slowly and stop any time an exercise causes pain. Do not exercise if your knee pain is flaring up. Warm up first. Stretching a cold  muscle can cause an injury. Do 5-10 minutes of easy movement or light stretching before beginning your exercise routine. Do 5-10 minutes of low-impact activity (like walking or cycling) before starting strengthening exercises. Contact your health care provider any time you have pain during or after exercising. Exercise may cause discomfort but should not be painful. It is normal to be a little stiff or sore after exercising.  Stretching and range-of-motion exercises Front thigh stretch  Stand up straight and support your body by holding on to a chair or resting one hand on a wall. With your legs straight and close together, bend one knee to lift your heel up toward your buttocks. Using one hand for support, grab your ankle with your free hand. Pull your foot up closer toward your buttocks to feel the stretch in front of your thigh. Hold the stretch for 30 seconds. Repeat __________ times. Complete this exercise __________ times a day. Back thigh stretch  Sit on the floor with your back straight and your legs out straight in front of you. Place the palms of your hands on the floor and slide them toward your feet as you bend at the hip. Try to touch your nose to your knees and feel the stretch in the back of your thighs. Hold for 30 seconds. Repeat __________ times. Complete this exercise __________ times a day. Calf stretch  Stand facing a wall. Place the palms of your hands flat against the wall, arms extended, and lean slightly against the wall. Get into a lunge position with one leg bent at the knee and the other leg stretched out straight behind you. Keep both feet facing the wall and increase the bend in your knee while keeping the heel of the other leg flat on the ground. You should feel the stretch in your calf. Hold for 30 seconds. Repeat __________ times. Complete this exercise __________ times a day. Strengthening exercises Straight leg lift Lie on your back with one knee bent  and the other leg out straight. Slowly lift the straight leg without bending the knee. Lift until your foot is about 12 inches (30 cm) off the floor. Hold for 3-5 seconds and slowly lower your leg. Repeat __________ times. Complete this exercise __________ times a day. Single leg dip Stand between two chairs and put both hands on the backs of  the chairs for support. Extend one leg out straight with your body weight resting on the heel of the standing leg. Slowly bend your standing knee to dip your body to the level that is comfortable for you. Hold for 3-5 seconds. Repeat __________ times. Complete this exercise __________ times a day. Hamstring curls Stand straight, knees close together, facing the back of a chair. Hold on to the back of a chair with both hands. Keep one leg straight. Bend the other knee while bringing the heel up toward the buttock until the knee is bent at a 90-degree angle (right angle). Hold for 3-5 seconds. Repeat __________ times. Complete this exercise __________ times a day. Wall squat Stand straight with your back, hips, and head against a wall. Step forward one foot at a time with your back still against the wall. Your feet should be 2 feet (61 cm) from the wall at shoulder width. Keeping your back, hips, and head against the wall, slide down the wall to as close of a sitting position as you can get. Hold for 5-10 seconds, then slowly slide back up. Repeat __________ times. Complete this exercise __________ times a day. Step-ups Step up with one foot onto a sturdy platform or stool that is about 6 inches (15 cm) high. Face sideways with one foot on the platform and one on the ground. Place all your weight on the platform foot and lift your body off the ground until your knee extends. Let your other leg hang free to the side. Hold for 3-5 seconds then slowly lower your weight down to the floor foot. Repeat __________ times. Complete this exercise __________  times a day. Contact a health care provider if: Your exercise causes pain. Your pain is worse after you exercise. Your pain prevents you from doing your exercises. This information is not intended to replace advice given to you by your health care provider. Make sure you discuss any questions you have with your health care provider. Document Revised: 02/11/2020 Document Reviewed: 10/05/2019 Elsevier Patient Education  [place].

## 2022-06-02 LAB — URIC ACID: Uric Acid, Serum: 5.2 mg/dL (ref 4.0–8.0)

## 2022-06-02 LAB — SEDIMENTATION RATE: Sed Rate: 9 mm/h (ref 0–20)

## 2022-06-02 LAB — CYCLIC CITRUL PEPTIDE ANTIBODY, IGG: Cyclic Citrullin Peptide Ab: <16 UNITS

## 2022-06-03 NOTE — Progress Notes (Signed)
All the labs are within normal limits.  I will discuss results at the follow-up visit.

## 2022-06-04 ENCOUNTER — Other Ambulatory Visit: Payer: Self-pay

## 2022-06-04 ENCOUNTER — Inpatient Hospital Stay: Payer: No Typology Code available for payment source | Attending: Nurse Practitioner | Admitting: Genetic Counselor

## 2022-06-04 DIAGNOSIS — Z1509 Genetic susceptibility to other malignant neoplasm: Secondary | ICD-10-CM | POA: Diagnosis not present

## 2022-06-04 DIAGNOSIS — Z1501 Genetic susceptibility to malignant neoplasm of breast: Secondary | ICD-10-CM

## 2022-06-04 DIAGNOSIS — Z803 Family history of malignant neoplasm of breast: Secondary | ICD-10-CM | POA: Diagnosis not present

## 2022-06-04 DIAGNOSIS — Z801 Family history of malignant neoplasm of trachea, bronchus and lung: Secondary | ICD-10-CM

## 2022-06-04 DIAGNOSIS — Z1379 Encounter for other screening for genetic and chromosomal anomalies: Secondary | ICD-10-CM

## 2022-06-04 DIAGNOSIS — Z8042 Family history of malignant neoplasm of prostate: Secondary | ICD-10-CM

## 2022-06-04 DIAGNOSIS — Z8041 Family history of malignant neoplasm of ovary: Secondary | ICD-10-CM

## 2022-06-04 NOTE — Progress Notes (Signed)
GENETIC TEST RESULTS   Patient Name: Trevor Bean Patient Age: 56 y.o. Encounter Date: 06/04/2022  Referring Provider: Betsy Coder, MD    Trevor Bean was seen in the [facility] clinic on June 04, 2022 due to a family history of cancer and concern regarding a hereditary predisposition to cancer in the family. Please refer to the prior Genetics clinic note for more information regarding Trevor Bean medical and family histories and our assessment at the time.   FAMILY HISTORY:  We obtained a detailed, 4-generation family history.  Significant diagnoses are listed below: Family History  Problem Relation Age of Onset   Hypertension Mother    Diabetes Mother    Breast cancer Father 71   Hypertension Father    Ovarian cancer Sister 34   Other Sister        murdered   Prostate cancer Maternal Uncle    Prostate cancer Maternal Uncle    Prostate cancer Maternal Uncle    Breast cancer Paternal Aunt    Lung cancer Paternal Uncle    Heart attack Maternal Grandmother    Stroke Maternal Grandfather    Lung cancer Paternal Grandmother    Other Paternal 64        old age   Breast cancer Daughter 53       The patient had one daughter who died of breast cancer at age 53.  He has two full sisters and a maternal half brother.  One sister has been diagnosed with either cervical or ovarian cancer.  The other sister was murdered at age 70.  He has a maternal half brother who is cancer free.   Both parents are living.   The patient's father was diagnosed with breast cancer.  He has three sisters and four brothers.  One sister had breast cancer.  The paternal grandparents are deceased.   The patient's mother has heart disease.  She has three brothers who have prostate cancer and three sisters who are cancer free.  The maternal grandparents are deceased.  The grandfather has a sister who had breast cancer and the grandmother has a niece who had breast cancer.   Trevor Bean is  unaware of previous family history of genetic testing for hereditary cancer risks. Patient's maternal ancestors are of African American descent, and paternal ancestors are of African American descent. There is no reported Ashkenazi Jewish ancestry. There is no known consanguinity.  GENETIC TESTING:  At the time of Trevor Bean visit, we recommended he pursue genetic testing of the CancerNext-Expanded+RNAinsight test. The genetic testing reported out on May 14, 2022 by [employer] identified a single, heterozygous pathogenic gene mutation called BRCA1, p.E1535* (c.4603G>T). There were no deleterious mutations in AIP, ALK, APC*, ATM*, AXIN2, BAP1, BARD1, BLM, BMPR1A, BRCA2*, BRIP1*, CDC73, CDH1*, CDK4, CDKN1B, CDKN2A, CHEK2*, CTNNA1, DICER1, FANCC, FH, FLCN, GALNT12, KIF1B, LZTR1, MAX, MEN1, MET, MLH1*, MSH2*, MSH3, MSH6*, MUTYH*, NBN, NF1*, NF2, NTHL1, PALB2*, PHOX2B, PMS2*, POT1, PRKAR1A, PTCH1, PTEN*, RAD51C*, RAD51D*, RB1, RECQL, RET, SDHA, SDHAF2, SDHB, SDHC, SDHD, SMAD4, SMARCA4, SMARCB1, SMARCE1, STK11, SUFU, TMEM127, TP53*, TSC1, TSC2, VHL and XRCC2 (sequencing and deletion/duplication); EGFR, EGLN1, HOXB13, KIT, MITF, PDGFRA, POLD1, and POLE (sequencing only); EPCAM and GREM1 (deletion/duplication only). DNA and RNA analyses performed for * genes. Marland Kitchen    CLINICAL CONDITION: The average woman's lifetime risk of developing breast cancer is 12%; her risk for developing ovarian cancer is 1.3% (SEER database 2014. https://seer.ShavedPoints.is. Accessed September 2017). Most cases of these cancers are sporadic and are  not due to hereditary factors, but approximately 5-10% of breast and ovarian cancer cases are hereditary and due to an identifiable pathogenic variant in a disease-causing gene. Hereditary breast and ovarian cancer syndrome (HBOC) due to pathogenic variants in the BRCA1 and BRCA2 genes accounts for the majority of hereditary breast and ovarian cancer cases in individuals with a strong family  history or an early-onset diagnosis.  HBOC syndrome is characterized by an increased lifetime risk for generally adult-onset cancers including breast, contralateral breast, male breast, ovarian, prostate, and pancreatic (PMID: 60109323).  The cancers associated with BRCA1 are:  Breast cancer, up to a 87% risk for women and 1-2% risk for men (PMID: 5573220, 25427062) Ovarian cancer, up to a 54% risk (PMID: 3762831, 51761607) Pancreatic cancer, 3-5% (PMID: 37106269, 48546270, 35009381, 82993716) Prostate cancer, 7-26% (96789381, 01751025)  INHERITANCE: Hereditary predisposition to cancer due to pathogenic variants in the BRCA1 gene has autosomal dominant inheritance. This means that an individual with a pathogenic variant has a 50% chance of passing the condition on to his/her offspring. Most cases are inherited from a parent, but some cases may occur spontaneously (i.e., an individual with a pathogenic variant has parents who do not have it). Identification of a pathogenic variant allows for the recognition of at-risk relatives who can pursue testing for the familial variant.  MANAGEMENT Management Guidelines for individuals with pathogenic BRCA1 variants have been developed by the [employer]  (NCCN):  NCCN cancer surveillance:  Females:  Breast awareness starting at age 49 Clinical breast exams every 6-12 months beginning at 55 years of age or at the age of the earliest diagnosed breast cancer in the family, if below age 65 Annual breast MRIs with contrast beginning between the ages of 7 and 46 (or annual mammograms with consideration of tomosynthesis if MRI is unavailable), although the age to initiate screening may be individualized based on family history Annual breast MRI with contrast and annual mammography with consideration of tomosynthesis between the ages of 68 and 38 After age 37, management should be considered on an individual basis. For women  treated for breast cancer, screening of remaining breast tissue with annual mammography and breast MRI should continue. Consider risk-reducing mastectomy and counsel regarding degree of protection, degree of cancer risk, and reconstruction options. Address the psychosocial, social, and quality-of-life aspects of undergoing risk-reducing mastectomy. Recommend risk-reducing salpingo-oophorectomy, typically between age 53 and 80 years and upon the completion of childbearing. See specific Risk-Reducing Salpingo-Oophorectomy (RRSO) Protocol in NCCN Guidelines for Ovarian Cancer - Principles of Surgery. Counseling includes a discussion of reproductive desires, extent of cancer risk, degree of protection for breast and ovarian cancer, management of menopausal symptoms, possible short-term hormonal replacement and medical issues. Salpingectomy alone is not the standard of care for risk reduction, although clinical trials of interval salpingectomy and delayed oophorectomy are ongoing. The concern for risk reducing salpingectomy alone is that women are still at risk for developing ovarian cancer. In addition, in pre-menopausal women, oophorectomy likely reduces the risk of developing breast cancer, but the magnitude is uncertain and may be gene specific. For those patients who have not elected RRSO, transvaginal ultrasound combined with serum CA-125 for ovarian cancer screening has not been shown to be sufficient sensitive or specific as to support a positive recommendation, but, although of uncertain benefit, it may be considered at the clinician's discretion starting at age 9-35 years. Consider risk-reducing agents as options for breast and ovarian cancer. Consider pancreatic cancer screening using  contrast-enhanced MRI/MRCP and/or  EUS for individuals with a family history of pancreatic cancer starting at age 80, or 41 years younger than the earliest age of onset.  Males:  Breast self-exam training and  education starting at age 32 years Clinical breast exam every 12 months, beginning at age 34 years Consider prostate-cancer screening beginning at age 21 years.  Additional risk reduction and management considerations:  Chemoprevention can reduce the risk of breast cancer in the contralateral breast in women with BRCA1 and BRCA2 mutations who have been diagnosed with breast cancer (PMID: 3500938, 18299371). Oral contraceptive use has been shown to reduce the risk of ovarian cancer by approximately 60% in BRCA mutation carriers if taken for at least 5 years (PMID: 6967893). Recent studies have some preliminary data that suggest PARP inhibitors may be a beneficial chemotherapeutic agent for a subset of patients with BRCA-associated breast and ovarian cancers. Clinical trials are currently in process to determine if and how these agents can be useful in the treatment of BRCA cancer patients (PMID: 81017510).  An individual's cancer risk and medical management are not determined by genetic test results alone. Overall cancer risk assessment incorporates additional factors including personal medical history, family history, as well as available genetic information that may result in a personalized plan for cancer prevention and surveillance.  FAMILY MEMBERS: It is important that all of Trevor Bean relatives (both men and women) know of the presence of this gene mutation. Site-specific genetic testing can sort out who in the family is at risk and who is not. It is advantageous to know if a BRCA1 pathogenic variant is present as medical management recommendations can be implemented. At-risk relatives can be identified, allowing pursuit of a diagnostic evaluation. In addition, the available information regarding hereditary cancer susceptibility genes is constantly evolving and more clinically relevant BRCA1 data is likely to become available in the near future. Awareness of this cancer predisposition allows  patients and their providers to be vigilant in maintaining close and regular contact with their local genetics clinic in anticipation of new information, inform at-risk family members, and diligently follow condition-specific screening protocols.  Trevor Bean daughter had breast cancer at age 84 and passed away.  There is a high chance she also had this mutation, based on her age of onset of cancer.  Her children, Trevor Bean grandchildren, should undergo genetic testing by age 16 so that they can undergo appropriate screening.  Trevor Bean siblings have a 50% chance to have inherited this mutation. We recommend they have genetic testing for this same mutation, as identifying the presence of this mutation would allow them to also take advantage of risk-reducing measures.   SUPPORT AND RESOURCES: If Trevor Bean is interested in BRCA-specific information and support, Facing Our Risk (www.facingourrisk.com) is one group some people have found useful. They provide opportunities to speak with other individuals from high-risk families. To locate genetic counselors in other cities, visit the website of the [employer] (ArtistMovie.se) and Secretary/administrator for a Social worker by zip code.  We encouraged Trevor Bean to remain in contact with [place] on an annual basis so we can update his personal and family histories, and let him know of advances in cancer genetics that may benefit the family. Our contact number was provided. Trevor Bean questions were answered to his satisfaction today, and he knows he is welcome to call anytime with additional questions.   Marytza Grandpre P. Florene Glen, [place], [facility] Licensed, Insurance risk surveyor [email or web address] phone: [phone]  The patient was seen  for a total of 32 minutes in face-to-face genetic counseling.

## 2022-06-05 ENCOUNTER — Telehealth: Payer: Self-pay | Admitting: Hematology and Oncology

## 2022-06-05 ENCOUNTER — Other Ambulatory Visit: Payer: Self-pay | Admitting: *Deleted

## 2022-06-05 DIAGNOSIS — Z1501 Genetic susceptibility to malignant neoplasm of breast: Secondary | ICD-10-CM

## 2022-06-05 NOTE — Telephone Encounter (Signed)
Scheduled appt per 8/15 referral. Pt is aware of appt date and time. Pt is aware to arrive 15 mins prior to appt time and to bring and updated insurance card. Pt is aware of appt location.

## 2022-06-05 NOTE — Progress Notes (Signed)
Placed referral to [facility] due to BRCA mutation noted w/genetics and family H/O cancer. Contacted breast clinic navigators to assist.
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
## 2022-06-18 ENCOUNTER — Ambulatory Visit: Payer: No Typology Code available for payment source | Admitting: Family Medicine

## 2022-06-21 ENCOUNTER — Inpatient Hospital Stay: Payer: No Typology Code available for payment source | Admitting: Hematology and Oncology

## 2022-06-21 ENCOUNTER — Inpatient Hospital Stay: Payer: No Typology Code available for payment source

## 2022-06-21 NOTE — Assessment & Plan Note (Deleted)
BRCA1 mutation: I discussed with the patient that BRCA1 is a tumor suppressor gene which helps repair damaged DNA. In patients with BRCA1 or 2 mutations, the damaged DNA could not be repaired properly increasing the risk of cancers. These mutations are inherited in autosomal dominant fashion and hence 50% probability that their children may have a BRCA mutation.  Male BRCA1 or 2 carriers have increased susceptibility to breast cancer and the risk is higher with BRCA2 versus BRCA1 mutations.  In men with BRCA2 gene mutation 1 study found the lifetime risk of breast cancer to age 84 be approximately 3.8% when compared with men with BRCA1 mutation whose risk is approximately 0.4%.  The lifetime risk for breast cancer in general population is approximately 0.1% for comparison.  Would recommend monthly self breast exams starting at age 65, clinical breast exam every 12 months starting at age 32.  Recommend prostate cancer screening starting at age 94 for BRCA2 carriers and consideration of prostate screening for BRCA1 carriers at age 43.  There is no consensus regarding screening for melanoma or pancreatic cancer however recommendations are based on individual's family history.  Possible recommendation in carriers include full body skin exams and pancreatic cancer screening for those with affected relatives.  With respect to colon cancer screening guidelines for management in BRCA1 carriers do not differ from those in the general population.  There is no clear role for tamoxifen as chemoprevention in BRCA1 or 2 carriers whose overall risk is low.
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
## 2022-06-27 ENCOUNTER — Ambulatory Visit: Payer: No Typology Code available for payment source | Attending: Rheumatology | Admitting: Rheumatology

## 2022-06-27 DIAGNOSIS — M19041 Primary osteoarthritis, right hand: Secondary | ICD-10-CM

## 2022-06-27 DIAGNOSIS — E785 Hyperlipidemia, unspecified: Secondary | ICD-10-CM

## 2022-06-27 DIAGNOSIS — M5030 Other cervical disc degeneration, unspecified cervical region: Secondary | ICD-10-CM

## 2022-06-27 DIAGNOSIS — M19071 Primary osteoarthritis, right ankle and foot: Secondary | ICD-10-CM

## 2022-06-27 DIAGNOSIS — Z8042 Family history of malignant neoplasm of prostate: Secondary | ICD-10-CM

## 2022-06-27 DIAGNOSIS — Z8041 Family history of malignant neoplasm of ovary: Secondary | ICD-10-CM

## 2022-06-27 DIAGNOSIS — Z803 Family history of malignant neoplasm of breast: Secondary | ICD-10-CM

## 2022-06-27 DIAGNOSIS — M7061 Trochanteric bursitis, right hip: Secondary | ICD-10-CM

## 2022-06-27 DIAGNOSIS — G8929 Other chronic pain: Secondary | ICD-10-CM

## 2022-06-27 DIAGNOSIS — R21 Rash and other nonspecific skin eruption: Secondary | ICD-10-CM

## 2022-07-25 ENCOUNTER — Ambulatory Visit: Payer: No Typology Code available for payment source | Attending: Family Medicine | Admitting: Family Medicine

## 2022-07-25 ENCOUNTER — Encounter: Payer: Self-pay | Admitting: Family Medicine

## 2022-07-25 VITALS — BP 113/69 | HR 59 | Temp 98.3°F | Ht 69.0 in | Wt 202.6 lb

## 2022-07-25 DIAGNOSIS — E782 Mixed hyperlipidemia: Secondary | ICD-10-CM | POA: Diagnosis not present

## 2022-07-25 DIAGNOSIS — E785 Hyperlipidemia, unspecified: Secondary | ICD-10-CM | POA: Insufficient documentation

## 2022-07-25 DIAGNOSIS — L309 Dermatitis, unspecified: Secondary | ICD-10-CM

## 2022-07-25 MED ORDER — TRIAMCINOLONE ACETONIDE 0.1 % EX CREA: 1.0000 | TOPICAL_CREAM | Freq: Two times a day (BID) | CUTANEOUS | 1 refills | Status: AC

## 2022-07-25 NOTE — Patient Instructions (Signed)

## 2022-07-25 NOTE — Progress Notes (Signed)
Itching on knees.
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
## 2022-11-07 ENCOUNTER — Ambulatory Visit: Payer: Self-pay | Attending: Family Medicine | Admitting: Family Medicine

## 2022-11-07 ENCOUNTER — Encounter: Payer: Self-pay | Admitting: Family Medicine

## 2022-11-07 VITALS — BP 122/66 | HR 60 | Temp 98.0°F | Ht 69.0 in | Wt 203.2 lb

## 2022-11-07 DIAGNOSIS — M2550 Pain in unspecified joint: Secondary | ICD-10-CM

## 2022-11-07 DIAGNOSIS — Z113 Encounter for screening for infections with a predominantly sexual mode of transmission: Secondary | ICD-10-CM

## 2022-11-07 DIAGNOSIS — Z131 Encounter for screening for diabetes mellitus: Secondary | ICD-10-CM

## 2022-11-07 DIAGNOSIS — E782 Mixed hyperlipidemia: Secondary | ICD-10-CM
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
## 2022-11-09 ENCOUNTER — Other Ambulatory Visit: Payer: Self-pay

## 2022-11-09 ENCOUNTER — Ambulatory Visit: Payer: Self-pay | Attending: Family Medicine

## 2022-11-09 ENCOUNTER — Other Ambulatory Visit (HOSPITAL_COMMUNITY)
Admission: RE | Admit: 2022-11-09 | Discharge: 2022-11-09 | Disposition: A | Discharge status: Home / Self Care | Payer: 59 | Source: Ambulatory Visit | Attending: Family Medicine | Admitting: Family Medicine

## 2022-11-09 DIAGNOSIS — M2550 Pain in unspecified joint: Secondary | ICD-10-CM

## 2022-11-09 DIAGNOSIS — Z131 Encounter for screening for diabetes mellitus: Secondary | ICD-10-CM

## 2022-11-09 DIAGNOSIS — Z113 Encounter for screening for infections with a predominantly sexual mode of transmission: Secondary | ICD-10-CM | POA: Insufficient documentation

## 2022-11-09 DIAGNOSIS — E782 Mixed hyperlipidemia: Secondary | ICD-10-CM

## 2022-11-10 LAB — LP+NON-HDL CHOLESTEROL
Cholesterol, Total: 221 mg/dL — ABNORMAL HIGH (ref 100–199)
HDL: 54 mg/dL (ref >39)
LDL Chol Calc (NIH): 161 mg/dL — ABNORMAL HIGH (ref 0–99)
Total Non-HDL-Chol (LDL+VLDL): 167 mg/dL — ABNORMAL HIGH (ref 0–129)
Triglycerides: 39 mg/dL (ref 0–149)
VLDL Cholesterol Cal: 6 mg/dL (ref 5–40)

## 2022-11-10 LAB — CMP14+EGFR
ALT: 16 IU/L (ref 0–44)
AST: 17 IU/L (ref 0–40)
Albumin/Globulin Ratio: 1.4 (ref 1.2–2.2)
Albumin: 4.2 g/dL (ref 3.8–4.9)
Alkaline Phosphatase: 59 IU/L (ref 44–121)
BUN/Creatinine Ratio: 13 (ref 9–20)
BUN: 10 mg/dL (ref 6–24)
Bilirubin Total: 0.3 mg/dL (ref 0.0–1.2)
CO2: 23 mmol/L (ref 20–29)
Calcium: 9 mg/dL (ref 8.7–10.2)
Chloride: 106 mmol/L (ref 96–106)
Creatinine, Ser: 0.76 mg/dL (ref 0.76–1.27)
Globulin, Total: 2.9 g/dL (ref 1.5–4.5)
Glucose: 92 mg/dL (ref 70–99)
Potassium: 3.9 mmol/L (ref 3.5–5.2)
Sodium: 142 mmol/L (ref 134–144)
Total Protein: 7.1 g/dL (ref 6.0–8.5)
eGFR: 106 mL/min/{1.73_m2} (ref >59)

## 2022-11-10 LAB — CBC WITH DIFFERENTIAL/PLATELET
Basophils Absolute: 0 10*3/uL (ref 0.0–0.2)
Basos: 1 %
EOS (ABSOLUTE): 0.3 10*3/uL (ref 0.0–0.4)
Eos: 7 %
Hematocrit: 39.7 % (ref 37.5–51.0)
Hemoglobin: 12.6 g/dL — ABNORMAL LOW (ref 13.0–17.7)
Immature Grans (Abs): 0 10*3/uL (ref 0.0–0.1)
Immature Granulocytes: 0 %
Lymphocytes Absolute: 1.8 10*3/uL (ref 0.7–3.1)
Lymphs: 44 %
MCH: 23.6 pg — ABNORMAL LOW (ref 26.6–33.0)
MCHC: 31.7 g/dL (ref 31.5–35.7)
MCV: 75 fL — ABNORMAL LOW (ref 79–97)
Monocytes Absolute: 0.4 10*3/uL (ref 0.1–0.9)
Monocytes: 9 %
Neutrophils Absolute: 1.6 10*3/uL (ref 1.4–7.0)
Neutrophils: 39 %
Platelets: 224 10*3/uL (ref 150–450)
RBC: 5.33 x10E6/uL (ref 4.14–5.80)
RDW: 14.8 % (ref 11.6–15.4)
WBC: 4 10*3/uL (ref 3.4–10.8)

## 2022-11-10 LAB — HEMOGLOBIN A1C
Est. average glucose Bld gHb Est-mCnc: 123 mg/dL
Hgb A1c MFr Bld: 5.9 % — ABNORMAL HIGH (ref 4.8–5.6)

## 2022-11-10 LAB — HIV ANTIBODY (ROUTINE TESTING W REFLEX): HIV Screen 4th Generation wRfx: NONREACTIVE

## 2022-11-12 LAB — URINE CYTOLOGY ANCILLARY ONLY
Chlamydia: NEGATIVE
Comment: NEGATIVE
Comment: NEGATIVE
Comment: NORMAL
Neisseria Gonorrhea: NEGATIVE
Trichomonas: NEGATIVE

## 2023-02-04 ENCOUNTER — Encounter: Payer: Self-pay | Admitting: *Deleted

## 2023-02-20 ENCOUNTER — Encounter: Payer: Self-pay | Admitting: Internal Medicine

## 2023-03-04 ENCOUNTER — Ambulatory Visit: Payer: Self-pay | Admitting: *Deleted

## 2023-03-04 NOTE — Telephone Encounter (Signed)
  Chief Complaint: SOB Symptoms: SOB with exertion. Increased fatigue, low energy. Frequency: 2 weeks Pertinent Negatives: Patient denies  Disposition: [] ED /[] Urgent Care (no appt availability in office) / [] Appointment(In office/virtual)/ []  [facility]/ [] Home Care/ [x] Refused Recommended Disposition /[] [facility]/ []  Follow-up with PCP Additional Notes: Pt has appt in July. Called for earlier appt., none available, placed on wait list. [facility], declines. Assured pt NT would route to practice for PCPs review. Advised ED for worsening symptoms, verbalizes understanding. Reason for Disposition  [1] MODERATE longstanding difficulty breathing (e.g., speaks in phrases, SOB even at rest, pulse 100-120) AND [2] SAME as normal  Answer Assessment - Initial Assessment Questions 1. RESPIRATORY STATUS: "Describe your breathing?" (e.g., wheezing, shortness of breath, unable to speak, severe coughing)      SOB with exertion 2. ONSET: "When did this breathing problem begin?"      2 weeks 3. PATTERN "Does the difficult breathing come and go, or has it been constant since it started?"      Constant 4. SEVERITY: "How bad is your breathing?" (e.g., mild, moderate, severe)    - MILD: No SOB at rest, mild SOB with walking, speaks normally in sentences, can lie down, no retractions, pulse < 100.    - MODERATE: SOB at rest, SOB with minimal exertion and prefers to sit, cannot lie down flat, speaks in phrases, mild retractions, audible wheezing, pulse 100-120.    - SEVERE: Very SOB at rest, speaks in single words, struggling to breathe, sitting hunched forward, retractions, pulse > 120      With exertion 5. RECURRENT SYMPTOM: "Have you had difficulty breathing before?" If Yes, ask: "When was the last time?" and "What happened that time?"           8. CAUSE: "What do you think is causing the breathing problem?"      Unsure 9. OTHER SYMPTOMS: "Do you have any other symptoms?  (e.g., dizziness, runny nose, cough, chest pain, fever)     Fatigue, generalized weakness  Protocols used: Breathing Difficulty-A-AH

## 2023-03-20 ENCOUNTER — Other Ambulatory Visit: Payer: Self-pay

## 2023-03-20 ENCOUNTER — Ambulatory Visit: Payer: 59 | Attending: Family Medicine | Admitting: Family Medicine

## 2023-03-20 VITALS — BP 106/67 | HR 57 | Ht 69.0 in | Wt 205.6 lb

## 2023-03-20 DIAGNOSIS — E785 Hyperlipidemia, unspecified: Secondary | ICD-10-CM

## 2023-03-20 DIAGNOSIS — K648 Other hemorrhoids: Secondary | ICD-10-CM

## 2023-03-20 DIAGNOSIS — R0609 Other forms of dyspnea: Secondary | ICD-10-CM

## 2023-03-20 DIAGNOSIS — R7303 Prediabetes: Secondary | ICD-10-CM

## 2023-03-20 MED ORDER — HYDROCORT-PRAMOXINE (PERIANAL) 2.5-1 % EX CREA
1.0000 | TOPICAL_CREAM | Freq: Three times a day (TID) | CUTANEOUS | 2 refills | Status: DC
  Filled 2023-03-20: qty 30, 10d supply, fill #0

## 2023-03-20 MED ORDER — ALBUTEROL SULFATE HFA 108 (90 BASE) MCG/ACT IN AERS
2.0000 | INHALATION_SPRAY | Freq: Four times a day (QID) | RESPIRATORY_TRACT | 2 refills | Status: AC | PRN
  Filled 2023-03-20: qty 6.7, 25d supply, fill #0

## 2023-03-20 MED ORDER — ATORVASTATIN CALCIUM 20 MG PO TABS
20.0000 mg | ORAL_TABLET | Freq: Every day | ORAL | 1 refills | Status: DC
  Filled 2023-03-20: qty 90, 90d supply, fill #0
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
## 2023-03-20 NOTE — Progress Notes (Signed)
Requesting medication for hemorrhoids SOB when walking

## 2023-03-20 NOTE — Patient Instructions (Signed)
Hemorrhoids Hemorrhoids are swollen veins in and around the rectum or the opening of the butt (anus). There are two types of hemorrhoids: Internal. These occur in the veins just inside the rectum. They may poke through to the outside and become irritated and painful. External. These occur in the veins outside the anus. They can be felt as a painful swelling or hard lump near the anus. Most hemorrhoids do not cause severe problems. Often, they can be treated at home with diet and lifestyle changes. If home treatments do not help, you may need a procedure to shrink or remove the hemorrhoids. What are the causes? Hemorrhoids are caused by pressure near the anus. This pressure may be caused by: Constipation or diarrhea. Straining to poop. Pregnancy. Obesity. Sitting or riding a bike for a long time. Heavy lifting or other things that cause you to strain. Anal sex. What are the signs or symptoms? Symptoms of this condition include: Pain. Anal itching or irritation. Bleeding from the rectum. Leakage of poop (stool). Swelling of the anus. One or more lumps around the anus. How is this diagnosed? Hemorrhoids can often be diagnosed through a visual exam. Other exams or tests may also be done, such as: A digital rectal exam. This is when your health care provider feels inside your rectum with a gloved finger. Anoscope. This is an exam of the anus using a small tube. A blood test, if you have lost a lot of blood. A sigmoidoscopy or colonoscopy. These are tests to look inside the colon using a tube with a camera on the end. How is this treated? In most cases, hemorrhoids can be treated at home with diet and lifestyle changes. If these changes do not help, you may need to have a procedure done. These procedures can make the hemorrhoids smaller or fully remove them. Common procedures include: Rubber band ligation. Rubber bands are placed at the base of the hemorrhoids to cut off their blood  supply. Sclerotherapy. Medicine is put into the hemorrhoids to shrink them. Infrared coagulation. A type of light energy is used to get rid of the hemorrhoids. Hemorrhoidectomy surgery. The hemorrhoids are removed during surgery. Then, the veins that supply them are tied off. Stapled hemorrhoidopexy surgery. The base of the hemorrhoid is stapled to the wall of the rectum. Follow these instructions at home: Medicines Take over-the-counter and prescription medicines only as told by your provider. Use medicated creams or medicines that are put in the rectum (suppositories) as told by your provider. Eating and drinking  Eat foods that are high in fiber, such as beans, whole grains, and fresh fruits and vegetables. Ask your provider about taking products that have fiber added to them (fiber supplements). Reduce the amount of fat in your diet. You can do this by eating low-fat dairy products, eating less red meat, and avoiding processed foods. Drink enough fluid to keep your pee (urine) pale yellow. Managing pain and swelling  Take warm sitz baths for 20 minutes, 3-4 times a day. This can help ease pain and discomfort. You may do this in a bathtub or you can use a portable sitz bath that fits over the toilet. If told, put ice on the affected area. It may help to use ice packs between sitz baths. Put ice in a plastic bag. Place a towel between your skin and the bag. Leave the ice on for 20 minutes, 2-3 times a day. If your skin turns bright red, remove the ice right away to prevent  skin damage. The risk of damage is higher if you cannot feel pain, heat, or cold. General instructions Exercise. Ask your provider how much and what kind of exercise is best for you. In general, you should do moderate exercise for at least 30 minutes on most days of the week (150 minutes each week). You may want to try walking, biking, or yoga. Go to the bathroom when you have the urge to poop. Do not wait. Avoid  straining to poop. Keep the anus dry and clean. Use wet toilet paper or moist towelettes after you poop. Do not sit on the toilet for a long time. This can increase blood pooling and pain. Where to find more information [employer] of Diabetes and Digestive and Kidney Diseases: [employer] Contact a health care provider if: You have more pain and swelling that do not get better with treatment. You have trouble pooping or you are not able to poop. You have pain or inflammation outside the area of the hemorrhoids. Get help right away if: You are bleeding from your rectum and you cannot get it to stop. This information is not intended to replace advice given to you by your health care provider. Make sure you discuss any questions you have with your health care provider. Document Revised: 06/20/2022 Document Reviewed: 06/20/2022 Elsevier Patient Education  2024 [employer].

## 2023-03-21 ENCOUNTER — Other Ambulatory Visit: Payer: Self-pay

## 2023-03-25 ENCOUNTER — Ambulatory Visit
Admission: RE | Admit: 2023-03-25 | Discharge: 2023-03-25 | Disposition: A | Discharge status: Home / Self Care | Payer: 59 | Source: Ambulatory Visit | Attending: Family Medicine | Admitting: Family Medicine

## 2023-03-25 ENCOUNTER — Ambulatory Visit: Payer: 59 | Attending: Family Medicine

## 2023-03-25 DIAGNOSIS — R0609 Other forms of dyspnea: Secondary | ICD-10-CM

## 2023-03-25 DIAGNOSIS — R7303 Prediabetes: Secondary | ICD-10-CM

## 2023-03-25 DIAGNOSIS — E785 Hyperlipidemia, unspecified: Secondary | ICD-10-CM

## 2023-03-26 LAB — CMP14+EGFR
AST: 18 IU/L (ref 0–40)
Alkaline Phosphatase: 59 IU/L (ref 44–121)
BUN: 12 mg/dL (ref 6–24)
Creatinine, Ser: 0.81 mg/dL (ref 0.76–1.27)
Globulin, Total: 2.8 g/dL (ref 1.5–4.5)
Total Protein: 7 g/dL (ref 6.0–8.5)

## 2023-03-26 LAB — PRO B NATRIURETIC PEPTIDE

## 2023-03-27 ENCOUNTER — Ambulatory Visit (INDEPENDENT_AMBULATORY_CARE_PROVIDER_SITE_OTHER): Payer: 59 | Admitting: Podiatry

## 2023-03-27 ENCOUNTER — Encounter: Payer: Self-pay | Admitting: Podiatry

## 2023-03-27 ENCOUNTER — Ambulatory Visit (INDEPENDENT_AMBULATORY_CARE_PROVIDER_SITE_OTHER): Payer: 59

## 2023-03-27 DIAGNOSIS — L84 Corns and callosities: Secondary | ICD-10-CM

## 2023-03-27 DIAGNOSIS — M79671 Pain in right foot: Secondary | ICD-10-CM | POA: Diagnosis not present

## 2023-03-27 DIAGNOSIS — M779 Enthesopathy, unspecified: Secondary | ICD-10-CM | POA: Diagnosis not present

## 2023-03-27 DIAGNOSIS — M79672 Pain in left foot: Secondary | ICD-10-CM | POA: Diagnosis not present

## 2023-03-27 DIAGNOSIS — M659 Unspecified synovitis and tenosynovitis, unspecified site: Secondary | ICD-10-CM

## 2023-03-27 DIAGNOSIS — M205X1 Other deformities of toe(s) (acquired), right foot: Secondary | ICD-10-CM

## 2023-03-27 MED ORDER — TRIAMCINOLONE ACETONIDE 10 MG/ML IJ SUSP
20.0000 mg | Freq: Once | INTRAMUSCULAR | Status: AC
Start: 2023-03-27 — End: 2023-03-27
  Administered 2023-03-27: 20 mg

## 2023-03-27 NOTE — Progress Notes (Unsigned)
Dg  

## 2023-03-28 NOTE — Progress Notes (Signed)
Subjective:   Patient ID: Trevor Bean, male   DOB: 57 y.o.   MRN: [id number]   HPI Patient presents stating he is having a lot of pain between his fourth and fifth toes on his left foot and his left big toe joint and on the outside of his right foot.  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  Also has very dry skin which is pressed   ROS      Objective:  Physical Exam  Neurovascular status was found to be intact muscle strength was adequate with patient found to have inflammation of the fourth interspace left keratotic lesion very painful when pressed and has prominence of the fifth metatarsal head right fluid buildup around the joint surface and reduced range of motion with inflammation of the first MPJ right no crepitus of the joint was noted.  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  I did do sterile prep I injected periarticular around the first MPJ 3 mg Dexasone Kenalog 5 mg Xylocaine I injected the extensor tendon complex right fifth metatarsal digit 3 mg Dexasone Kenalog 5 mg Xylocaine in the fourth MPJ with 3 mg Dexasone Kenalog 5 mg Xylocaine I debrided lesions and I will see back again in 6 weeks this may ultimately require surgery depending on response to conservative treatment  X-rays indicate that there is significant arthritis of the first MPJ right there is inflammation around the fifth metatarsal head and pressure between the fourth and fifth digits left

## 2023-04-02 LAB — CMP14+EGFR
ALT: 18 IU/L (ref 0–44)
Albumin/Globulin Ratio: 1.5 (ref 1.2–2.2)
Albumin: 4.2 g/dL (ref 3.8–4.9)
BUN/Creatinine Ratio: 15 (ref 9–20)
Bilirubin Total: 0.3 mg/dL (ref 0.0–1.2)
CO2: 18 mmol/L — ABNORMAL LOW (ref 20–29)
Calcium: 9 mg/dL (ref 8.7–10.2)
Chloride: 110 mmol/L — ABNORMAL HIGH (ref 96–106)
Glucose: 93 mg/dL (ref 70–99)
Potassium: 4.3 mmol/L (ref 3.5–5.2)
Sodium: 143 mmol/L (ref 134–144)
eGFR: 104 mL/min/{1.73_m2} (ref >59)

## 2023-04-02 LAB — LP+NON-HDL CHOLESTEROL
Cholesterol, Total: 200 mg/dL — ABNORMAL HIGH (ref 100–199)
HDL: 46 mg/dL (ref >39)
LDL Chol Calc (NIH): 145 mg/dL — ABNORMAL HIGH (ref 0–99)
Total Non-HDL-Chol (LDL+VLDL): 154 mg/dL — ABNORMAL HIGH (ref 0–129)
Triglycerides: 50 mg/dL (ref 0–149)
VLDL Cholesterol Cal: 9 mg/dL (ref 5–40)

## 2023-04-02 LAB — HEMOGLOBIN A1C
Est. average glucose Bld gHb Est-mCnc: 126 mg/dL
Hgb A1c MFr Bld: 6 % — ABNORMAL HIGH (ref 4.8–5.6)

## 2023-04-29 ENCOUNTER — Ambulatory Visit (INDEPENDENT_AMBULATORY_CARE_PROVIDER_SITE_OTHER): Payer: 59 | Admitting: Podiatry

## 2023-04-29 DIAGNOSIS — B353 Tinea pedis: Secondary | ICD-10-CM | POA: Diagnosis not present

## 2023-04-29 MED ORDER — MUPIROCIN 2 % EX OINT
1.0000 | TOPICAL_OINTMENT | Freq: Two times a day (BID) | CUTANEOUS | 3 refills | Status: DC
  Filled 2023-04-29: qty 22, 11d supply, fill #0
  Filled 2023-05-06 – 2023-05-17 (×3): qty 22, 11d supply, fill #1

## 2023-04-29 MED ORDER — CLOTRIMAZOLE-BETAMETHASONE 1-0.05 % EX CREA
1.0000 | TOPICAL_CREAM | Freq: Every day | CUTANEOUS | 3 refills | Status: DC
  Filled 2023-04-29: qty 45, 30d supply, fill #0
  Filled 2023-05-06: qty 45, 30d supply, fill #1

## 2023-04-29 MED ORDER — TERBINAFINE HCL 250 MG PO TABS
250.0000 mg | ORAL_TABLET | Freq: Every day | ORAL | 0 refills | Status: DC
  Filled 2023-04-29: qty 30, 30d supply, fill #0

## 2023-04-30 ENCOUNTER — Other Ambulatory Visit: Payer: Self-pay
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
## 2023-05-06 ENCOUNTER — Other Ambulatory Visit: Payer: Self-pay

## 2023-05-08 ENCOUNTER — Ambulatory Visit: Payer: 59 | Admitting: Podiatry

## 2023-05-09 ENCOUNTER — Other Ambulatory Visit: Payer: Self-pay

## 2023-05-15 ENCOUNTER — Other Ambulatory Visit: Payer: Self-pay

## 2023-05-16 ENCOUNTER — Ambulatory Visit: Payer: Self-pay | Admitting: Family Medicine

## 2023-05-17 ENCOUNTER — Other Ambulatory Visit: Payer: Self-pay

## 2023-05-29 ENCOUNTER — Ambulatory Visit: Payer: 59 | Admitting: Podiatry

## 2023-05-29 DIAGNOSIS — M205X1 Other deformities of toe(s) (acquired), right foot: Secondary | ICD-10-CM | POA: Diagnosis not present

## 2023-05-29 DIAGNOSIS — B353 Tinea pedis: Secondary | ICD-10-CM

## 2023-05-29 MED ORDER — MUPIROCIN 2 % EX OINT
1.0000 | TOPICAL_OINTMENT | Freq: Two times a day (BID) | CUTANEOUS | 3 refills | Status: DC
  Filled 2023-05-29: qty 30, 15d supply, fill #0
  Filled 2023-05-30 – 2023-06-17 (×2): qty 22, 11d supply, fill #0
  Filled 2023-10-05 – 2023-11-29 (×2): qty 22, 11d supply, fill #1

## 2023-05-29 MED ORDER — TERBINAFINE HCL 250 MG PO TABS
250.0000 mg | ORAL_TABLET | Freq: Every day | ORAL | 1 refills | Status: DC
  Filled 2023-05-29 – 2023-06-17 (×2): qty 30, 30d supply, fill #0
  Filled 2023-10-05: qty 30, 30d supply, fill #1

## 2023-05-29 MED ORDER — CLOTRIMAZOLE-BETAMETHASONE 1-0.05 % EX CREA
1.0000 | TOPICAL_CREAM | Freq: Every day | CUTANEOUS | 3 refills | Status: DC
  Filled 2023-05-29: qty 45, 30d supply, fill #0
  Filled 2023-06-17: qty 45, 45d supply, fill #0
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
## 2023-05-30 ENCOUNTER — Other Ambulatory Visit: Payer: Self-pay

## 2023-06-05 ENCOUNTER — Other Ambulatory Visit: Payer: Self-pay

## 2023-06-06 ENCOUNTER — Other Ambulatory Visit: Payer: Self-pay

## 2023-06-17 ENCOUNTER — Other Ambulatory Visit: Payer: Self-pay

## 2023-07-29 ENCOUNTER — Ambulatory Visit: Payer: 59 | Admitting: Podiatry

## 2023-08-19 ENCOUNTER — Ambulatory Visit (INDEPENDENT_AMBULATORY_CARE_PROVIDER_SITE_OTHER): Payer: 59 | Admitting: Podiatry

## 2023-08-19 ENCOUNTER — Encounter: Payer: Self-pay | Admitting: Podiatry

## 2023-08-19 VITALS — Ht 69.0 in | Wt 205.0 lb

## 2023-08-19 DIAGNOSIS — B353 Tinea pedis: Secondary | ICD-10-CM

## 2023-08-19 NOTE — Progress Notes (Signed)
Spoke with patient via telephone and apologized for the wait time.  Patient was brought into the treatment room however he had to wait this evening for a prolonged period of time since we are backed up.  Eventually he had to leave to pick up his children from school.  Patient was very understanding  I told the patient that we will have our office give him a call to reschedule.  Felecia Shelling, DPM [facility]  Dr. Felecia Shelling, DPM    [address] [place], [place] [address]                Office [phone]  Fax [phone]

## 2023-08-21 ENCOUNTER — Other Ambulatory Visit: Payer: Self-pay

## 2023-08-21 ENCOUNTER — Ambulatory Visit (INDEPENDENT_AMBULATORY_CARE_PROVIDER_SITE_OTHER): Payer: 59 | Admitting: Podiatry

## 2023-08-21 DIAGNOSIS — D2371 Other benign neoplasm of skin of right lower limb, including hip: Secondary | ICD-10-CM

## 2023-08-21 DIAGNOSIS — M205X1 Other deformities of toe(s) (acquired), right foot: Secondary | ICD-10-CM | POA: Diagnosis not present

## 2023-08-21 DIAGNOSIS — M2042 Other hammer toe(s) (acquired), left foot: Secondary | ICD-10-CM | POA: Diagnosis not present

## 2023-08-21 MED ORDER — CLOTRIMAZOLE-BETAMETHASONE 1-0.05 % EX CREA
1.0000 | TOPICAL_CREAM | Freq: Every day | CUTANEOUS | 3 refills | Status: AC
  Filled 2023-08-21: qty 45, 15d supply, fill #0
  Filled 2023-10-05: qty 45, 45d supply, fill #0
  Filled 2023-11-29: qty 45, 30d supply, fill #0
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
## 2023-09-02 ENCOUNTER — Other Ambulatory Visit: Payer: Self-pay

## 2023-09-02 ENCOUNTER — Telehealth: Payer: Self-pay | Admitting: Podiatry

## 2023-09-02 NOTE — Telephone Encounter (Signed)
Pt scheduled for surgery and received the quote from the surgery center and has decided he is only wanting to do the surgery on the right toe as that is the one causing the most pain. He would like a call from you please.

## 2023-09-04 ENCOUNTER — Telehealth: Payer: Self-pay | Admitting: Podiatry

## 2023-09-04 NOTE — Telephone Encounter (Signed)
I spoke with patient. Lets keep the consent the same for now. The procedure to fix the left fifth toe is minimal an really won't change his cost. The main cost is the facility fee from the surgery center and anesthesia fees. He is still good for surgery scheduled on the 21st I believe. Thanks, Dr. Logan Bores

## 2023-09-04 NOTE — Telephone Encounter (Signed)
DOS-09/12/2023  Trevor Bean [id number] HAMMERTOE REPAIR 5TH [id number]  [facility] EFFECTIVE DATE-09/21/2022  DEDUCTIBLE-$2500.00 WITH REMAINING $1556.85 OOP-$5000.00 WITH REMAINING $1914.78 COINSURANCE- 0%  PER THE [employer] WEBSITE PORTAL, PRIOR AUTH HAS BEEN APPROVED FOR CPT CODES 29562 AND [phone]. GOOD FROM 09/12/2023 - 12/11/2023.  AUTH REFERENCE NUMBER: [id number]

## 2023-09-12 ENCOUNTER — Other Ambulatory Visit: Payer: Self-pay

## 2023-09-12 ENCOUNTER — Other Ambulatory Visit: Payer: Self-pay | Admitting: Podiatry

## 2023-09-12 DIAGNOSIS — M205X1 Other deformities of toe(s) (acquired), right foot: Secondary | ICD-10-CM | POA: Diagnosis not present

## 2023-09-12 MED ORDER — OXYCODONE-ACETAMINOPHEN 5-325 MG PO TABS
1.0000 | ORAL_TABLET | ORAL | 0 refills | Status: DC | PRN
  Filled 2023-09-12: qty 30, 5d supply, fill #0

## 2023-09-12 MED ORDER — IBUPROFEN 800 MG PO TABS
800.0000 mg | ORAL_TABLET | Freq: Three times a day (TID) | ORAL | 1 refills | Status: DC
  Filled 2023-09-12: qty 60, 20d supply, fill #0
  Filled 2023-10-05: qty 60, 20d supply, fill #1

## 2023-09-12 NOTE — Progress Notes (Signed)
PRN postop  Felecia Shelling, DPM [facility]  Dr. Felecia Shelling, DPM    [address] [place], [place] [address]                Office [phone]  Fax [phone]

## 2023-09-13 ENCOUNTER — Ambulatory Visit (INDEPENDENT_AMBULATORY_CARE_PROVIDER_SITE_OTHER): Payer: 59 | Admitting: Podiatry

## 2023-09-13 ENCOUNTER — Encounter: Payer: Self-pay | Admitting: Podiatry

## 2023-09-13 ENCOUNTER — Other Ambulatory Visit: Payer: Self-pay

## 2023-09-13 DIAGNOSIS — Z9889 Other specified postprocedural states: Secondary | ICD-10-CM

## 2023-09-13 DIAGNOSIS — M205X1 Other deformities of toe(s) (acquired), right foot: Secondary | ICD-10-CM

## 2023-09-13 MED ORDER — PROMETHAZINE HCL 12.5 MG PO TABS
12.5000 mg | ORAL_TABLET | Freq: Three times a day (TID) | ORAL | 0 refills | Status: DC | PRN
  Filled 2023-09-13: qty 20, 7d supply, fill #0

## 2023-09-16 ENCOUNTER — Telehealth: Payer: Self-pay | Admitting: Podiatry

## 2023-09-16 ENCOUNTER — Encounter: Payer: Self-pay | Admitting: Podiatry

## 2023-09-16 NOTE — Telephone Encounter (Signed)
Faxed disability paperwork to [employer] for the patient -- per the provider, RTW date is 11/07/2023.  Also provided a letter to the patient stating same (uploaded to MyChart) ...    J. Abbott -- 09/16/2023
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
## 2023-09-18 ENCOUNTER — Encounter: Payer: Self-pay | Admitting: Podiatry

## 2023-09-18 ENCOUNTER — Ambulatory Visit: Payer: 59

## 2023-09-18 ENCOUNTER — Ambulatory Visit (INDEPENDENT_AMBULATORY_CARE_PROVIDER_SITE_OTHER): Payer: 59 | Admitting: Podiatry

## 2023-09-18 DIAGNOSIS — M205X1 Other deformities of toe(s) (acquired), right foot: Secondary | ICD-10-CM | POA: Diagnosis not present

## 2023-09-18 DIAGNOSIS — Z9889 Other specified postprocedural states: Secondary | ICD-10-CM
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
## 2023-09-24 ENCOUNTER — Other Ambulatory Visit (HOSPITAL_COMMUNITY)
Admission: RE | Admit: 2023-09-24 | Discharge: 2023-09-24 | Disposition: A | Discharge status: Home / Self Care | Payer: 59 | Source: Ambulatory Visit | Attending: Family Medicine | Admitting: Family Medicine

## 2023-09-24 ENCOUNTER — Other Ambulatory Visit: Payer: Self-pay

## 2023-09-24 ENCOUNTER — Encounter: Payer: Self-pay | Admitting: Family Medicine

## 2023-09-24 ENCOUNTER — Ambulatory Visit: Payer: 59 | Attending: Family Medicine | Admitting: Family Medicine

## 2023-09-24 VITALS — BP 119/72 | HR 63 | Ht 69.0 in | Wt 201.4 lb

## 2023-09-24 DIAGNOSIS — M549 Dorsalgia, unspecified: Secondary | ICD-10-CM

## 2023-09-24 DIAGNOSIS — E785 Hyperlipidemia, unspecified: Secondary | ICD-10-CM

## 2023-09-24 DIAGNOSIS — Z113 Encounter for screening for infections with a predominantly sexual mode of transmission: Secondary | ICD-10-CM

## 2023-09-24 DIAGNOSIS — R7303 Prediabetes: Secondary | ICD-10-CM

## 2023-09-24 MED ORDER — TIZANIDINE HCL 4 MG PO TABS
4.0000 mg | ORAL_TABLET | Freq: Three times a day (TID) | ORAL | 1 refills | Status: DC | PRN
  Filled 2023-09-24: qty 60, 20d supply, fill #0

## 2023-09-24 NOTE — Patient Instructions (Signed)
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
## 2023-09-25 ENCOUNTER — Ambulatory Visit (INDEPENDENT_AMBULATORY_CARE_PROVIDER_SITE_OTHER): Payer: 59 | Admitting: Podiatry

## 2023-09-25 ENCOUNTER — Ambulatory Visit: Payer: 59 | Attending: Family Medicine

## 2023-09-25 ENCOUNTER — Other Ambulatory Visit: Payer: Self-pay

## 2023-09-25 DIAGNOSIS — M205X1 Other deformities of toe(s) (acquired), right foot: Secondary | ICD-10-CM

## 2023-09-25 DIAGNOSIS — E785 Hyperlipidemia, unspecified: Secondary | ICD-10-CM

## 2023-09-25 DIAGNOSIS — Z113 Encounter for screening for infections with a predominantly sexual mode of transmission: Secondary | ICD-10-CM

## 2023-09-25 DIAGNOSIS — R7303 Prediabetes: Secondary | ICD-10-CM

## 2023-09-25 LAB — URINE CYTOLOGY ANCILLARY ONLY
Chlamydia: NEGATIVE
Comment: NEGATIVE
Comment: NEGATIVE
Comment: NORMAL
Neisseria Gonorrhea: NEGATIVE
Trichomonas: POSITIVE — AB
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
## 2023-09-26 ENCOUNTER — Other Ambulatory Visit: Payer: Self-pay

## 2023-09-26 ENCOUNTER — Other Ambulatory Visit: Payer: Self-pay | Admitting: Family Medicine

## 2023-09-26 DIAGNOSIS — E785 Hyperlipidemia, unspecified: Secondary | ICD-10-CM

## 2023-09-26 LAB — CMP14+EGFR
ALT: 13 [IU]/L (ref 0–44)
AST: 12 [IU]/L (ref 0–40)
Albumin: 4.2 g/dL (ref 3.8–4.9)
Alkaline Phosphatase: 56 [IU]/L (ref 44–121)
BUN/Creatinine Ratio: 20 (ref 9–20)
BUN: 16 mg/dL (ref 6–24)
Bilirubin Total: 0.3 mg/dL (ref 0.0–1.2)
CO2: 22 mmol/L (ref 20–29)
Calcium: 9.2 mg/dL (ref 8.7–10.2)
Chloride: 105 mmol/L (ref 96–106)
Creatinine, Ser: 0.8 mg/dL (ref 0.76–1.27)
Globulin, Total: 2.9 g/dL (ref 1.5–4.5)
Glucose: 86 mg/dL (ref 70–99)
Potassium: 4 mmol/L (ref 3.5–5.2)
Sodium: 142 mmol/L (ref 134–144)
Total Protein: 7.1 g/dL (ref 6.0–8.5)
eGFR: 105 mL/min/{1.73_m2} (ref >59)

## 2023-09-26 LAB — HIV ANTIBODY (ROUTINE TESTING W REFLEX): HIV Screen 4th Generation wRfx: NONREACTIVE

## 2023-09-26 LAB — HEMOGLOBIN A1C
Est. average glucose Bld gHb Est-mCnc: 120 mg/dL
Hgb A1c MFr Bld: 5.8 % — ABNORMAL HIGH (ref 4.8–5.6)

## 2023-09-26 LAB — CBC WITH DIFFERENTIAL/PLATELET
Basophils Absolute: 0 10*3/uL (ref 0.0–0.2)
Basos: 1 %
EOS (ABSOLUTE): 0.2 10*3/uL (ref 0.0–0.4)
Eos: 5 %
Hematocrit: 39.7 % (ref 37.5–51.0)
Hemoglobin: 12.4 g/dL — ABNORMAL LOW (ref 13.0–17.7)
Immature Grans (Abs): 0 10*3/uL (ref 0.0–0.1)
Immature Granulocytes: 0 %
Lymphocytes Absolute: 1.7 10*3/uL (ref 0.7–3.1)
Lymphs: 38 %
MCH: 23.6 pg — ABNORMAL LOW (ref 26.6–33.0)
MCHC: 31.2 g/dL — ABNORMAL LOW (ref 31.5–35.7)
MCV: 76 fL — ABNORMAL LOW (ref 79–97)
Monocytes Absolute: 0.5 10*3/uL (ref 0.1–0.9)
Monocytes: 11 %
Neutrophils Absolute: 2 10*3/uL (ref 1.4–7.0)
Neutrophils: 45 %
Platelets: 238 10*3/uL (ref 150–450)
RBC: 5.25 x10E6/uL (ref 4.14–5.80)
RDW: 14.5 % (ref 11.6–15.4)
WBC: 4.3 10*3/uL (ref 3.4–10.8)

## 2023-09-26 LAB — LP+NON-HDL CHOLESTEROL
Cholesterol, Total: 217 mg/dL — ABNORMAL HIGH (ref 100–199)
HDL: 50 mg/dL (ref >39)
LDL Chol Calc (NIH): 158 mg/dL — ABNORMAL HIGH (ref 0–99)
Total Non-HDL-Chol (LDL+VLDL): 167 mg/dL — ABNORMAL HIGH (ref 0–129)
Triglycerides: 51 mg/dL (ref 0–149)
VLDL Cholesterol Cal: 9 mg/dL (ref 5–40)

## 2023-09-26 MED ORDER — METRONIDAZOLE 500 MG PO TABS
2000.0000 mg | ORAL_TABLET | Freq: Once | ORAL | 0 refills | Status: AC
  Filled 2023-09-26 – 2023-11-29 (×2): qty 4, 1d supply, fill #0

## 2023-09-26 MED ORDER — ATORVASTATIN CALCIUM 20 MG PO TABS
20.0000 mg | ORAL_TABLET | Freq: Every day | ORAL | 1 refills | Status: DC
  Filled 2023-09-26: qty 90, 90d supply, fill #0

## 2023-09-27 ENCOUNTER — Other Ambulatory Visit: Payer: Self-pay

## 2023-10-05 ENCOUNTER — Other Ambulatory Visit: Payer: Self-pay

## 2023-10-07 ENCOUNTER — Other Ambulatory Visit: Payer: Self-pay

## 2023-10-09 ENCOUNTER — Other Ambulatory Visit: Payer: Self-pay

## 2023-10-09 ENCOUNTER — Encounter: Payer: Self-pay | Admitting: Podiatry

## 2023-10-09 ENCOUNTER — Ambulatory Visit (INDEPENDENT_AMBULATORY_CARE_PROVIDER_SITE_OTHER): Payer: 59 | Admitting: Podiatry

## 2023-10-09 ENCOUNTER — Ambulatory Visit (INDEPENDENT_AMBULATORY_CARE_PROVIDER_SITE_OTHER): Payer: 59

## 2023-10-09 DIAGNOSIS — M205X1 Other deformities of toe(s) (acquired), right foot: Secondary | ICD-10-CM

## 2023-10-09 DIAGNOSIS — Z9889 Other specified postprocedural states: Secondary | ICD-10-CM
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
## 2023-10-15 ENCOUNTER — Other Ambulatory Visit: Payer: Self-pay

## 2023-10-17 ENCOUNTER — Telehealth: Payer: Self-pay | Admitting: Podiatry

## 2023-10-17 NOTE — Telephone Encounter (Signed)
Per Dr. Logan Bores, this patients RTW date has been extended to 01/05/2024 -- there is a letter in the file stating the same.  Faxed the updated letter and a note to [employer] ([facility]) to advise the same ..... [Fax# [phone]]   ....   J. Abbott -- 10/17/2023

## 2023-10-29 ENCOUNTER — Telehealth: Payer: Self-pay | Admitting: Podiatry

## 2023-10-29 NOTE — Telephone Encounter (Signed)
 Trevor Bean called ...  He said [employer] needed (1) Ops Report from 09/12/2023 surgery date and (2) Letter confirming his RTW (01/05/2024) .Marland Kitchen...    Faxed both items to [employer] - [phone], attn:  Ms. Quillian Quince   (per patient)  ....   J. Abbott -- 10/29/2023

## 2023-11-20 ENCOUNTER — Encounter: Payer: Self-pay | Admitting: Podiatry

## 2023-11-20 ENCOUNTER — Ambulatory Visit (INDEPENDENT_AMBULATORY_CARE_PROVIDER_SITE_OTHER): Payer: 59 | Admitting: Podiatry

## 2023-11-20 DIAGNOSIS — M205X1 Other deformities of toe(s) (acquired), right foot: Secondary | ICD-10-CM
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
## 2023-11-29 ENCOUNTER — Other Ambulatory Visit: Payer: Self-pay

## 2023-12-31 ENCOUNTER — Other Ambulatory Visit: Payer: Self-pay

## 2023-12-31 ENCOUNTER — Telehealth: Payer: Self-pay

## 2023-12-31 MED ORDER — METRONIDAZOLE 500 MG PO TABS
2000.0000 mg | ORAL_TABLET | Freq: Once | ORAL | 0 refills | Status: AC
Start: 2023-12-31 — End: 2024-01-01
  Filled 2023-12-31: qty 4, 1d supply, fill #0

## 2023-12-31 NOTE — Telephone Encounter (Signed)
 Rx for Metronidazole has been sent to the Pharmacy.

## 2023-12-31 NOTE — Addendum Note (Signed)
 Addended byHoy Register on: 12/31/2023 01:00 PM   Modules accepted: Orders

## 2023-12-31 NOTE — Telephone Encounter (Signed)
Patient has been called and informed of medication being sent to pharmacy. 

## 2023-12-31 NOTE — Telephone Encounter (Signed)
 Copied from CRM [id number]. Topic: Clinical - Prescription Issue >> Dec 31, 2023 10:04 AM Martha Clan wrote: Reason for CRM: Patient was prescribed medication for a infection back in December of 2024 and never took his medication. Patient recently moved houses and lost his medication during the transition. Patient wanted to get a reorder so he can take the medication to treat the issue. metroNIDAZOLE (FLAGYL) 500 MG tablet [[id number]] Rx #: [id number]  clotrimazole-betamethasone (LOTRISONE) cream

## 2024-01-01 ENCOUNTER — Ambulatory Visit (HOSPITAL_COMMUNITY)
Admission: EM | Admit: 2024-01-01 | Discharge: 2024-01-01 | Disposition: A | Discharge status: Home / Self Care | Attending: Family Medicine | Admitting: Family Medicine

## 2024-01-01 ENCOUNTER — Encounter (HOSPITAL_COMMUNITY): Payer: Self-pay | Admitting: Emergency Medicine

## 2024-01-01 ENCOUNTER — Other Ambulatory Visit: Payer: Self-pay

## 2024-01-01 DIAGNOSIS — J029 Acute pharyngitis, unspecified: Secondary | ICD-10-CM | POA: Diagnosis not present

## 2024-01-01 DIAGNOSIS — R590 Localized enlarged lymph nodes: Secondary | ICD-10-CM | POA: Diagnosis not present

## 2024-01-01 MED ORDER — AMOXICILLIN 875 MG PO TABS
875.0000 mg | ORAL_TABLET | Freq: Two times a day (BID) | ORAL | 0 refills | Status: AC
Start: 2024-01-01 — End: 2024-01-11
  Filled 2024-01-01: qty 20, 10d supply, fill #0

## 2024-01-01 NOTE — Discharge Instructions (Signed)
 You were seen today for sore throat and gland swelling.  I have sent out an antibiotic to use twice/day x 10 days for now.  You may use motrin as well for pain and swelling.  If your symptoms are not improving within 48 hrs, then please go to the ER for further testing and evaluation.
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
## 2024-01-01 NOTE — ED Triage Notes (Addendum)
 Sore throat only hurts on left side of throat and reports left ear is very painful, feels cold and hot and complains of feeling very weak.  Patient has not checked temperature  Has taken tylenol and ibuprofen

## 2024-01-01 NOTE — ED Notes (Signed)
Provided blankets 

## 2024-01-03 ENCOUNTER — Telehealth: Payer: Self-pay

## 2024-01-03 DIAGNOSIS — R070 Pain in throat: Secondary | ICD-10-CM

## 2024-01-03 NOTE — Telephone Encounter (Signed)
 Patient states that he went to the ED(was not seen due to wait time) for throat pain, he states that he feel his throat is closing up so he is requesting a ENT referral     Copied from CRM [id number]. Topic: Referral - Request for Referral >> Jan 03, 2024  8:49 AM Franchot Heidelberg wrote: Did the patient discuss referral with their provider in the last year? Yes (If No - schedule appointment) (If Yes - send message)  Appointment offered? No  Type of order/referral and detailed reason for visit: ENT   Preference of office, provider, location: [facility]   Fax: [phone]  If referral order, have you been seen by this specialty before? Yes (If Yes, this issue or another issue? When? Where?  Can we respond through MyChart? Yes

## 2024-01-04 ENCOUNTER — Other Ambulatory Visit: Payer: Self-pay

## 2024-01-04 ENCOUNTER — Emergency Department (HOSPITAL_COMMUNITY)
Admission: EM | Admit: 2024-01-04 | Discharge: 2024-01-04 | Disposition: A | Discharge status: Home / Self Care | Attending: Student | Admitting: Student

## 2024-01-04 ENCOUNTER — Encounter (HOSPITAL_COMMUNITY): Payer: Self-pay | Admitting: *Deleted

## 2024-01-04 ENCOUNTER — Telehealth: Payer: Self-pay

## 2024-01-04 ENCOUNTER — Other Ambulatory Visit (HOSPITAL_BASED_OUTPATIENT_CLINIC_OR_DEPARTMENT_OTHER): Payer: Self-pay

## 2024-01-04 DIAGNOSIS — J029 Acute pharyngitis, unspecified: Secondary | ICD-10-CM | POA: Insufficient documentation

## 2024-01-04 DIAGNOSIS — R6883 Chills (without fever): Secondary | ICD-10-CM | POA: Diagnosis not present

## 2024-01-04 DIAGNOSIS — Z202 Contact with and (suspected) exposure to infections with a predominantly sexual mode of transmission: Secondary | ICD-10-CM | POA: Insufficient documentation

## 2024-01-04 DIAGNOSIS — Z87891 Personal history of nicotine dependence: Secondary | ICD-10-CM | POA: Insufficient documentation

## 2024-01-04 DIAGNOSIS — R6884 Jaw pain: Secondary | ICD-10-CM | POA: Insufficient documentation

## 2024-01-04 LAB — RESP PANEL BY RT-PCR (RSV, FLU A&B, COVID)  RVPGX2
Influenza A by PCR: NEGATIVE
Influenza B by PCR: NEGATIVE
Resp Syncytial Virus by PCR: NEGATIVE
SARS Coronavirus 2 by RT PCR: NEGATIVE

## 2024-01-04 LAB — RPR: RPR Ser Ql: NONREACTIVE

## 2024-01-04 LAB — RAPID HIV SCREEN (HIV 1/2 AB+AG)
HIV 1/2 Antibodies: NONREACTIVE
HIV-1 P24 Antigen - HIV24: NONREACTIVE

## 2024-01-04 LAB — GROUP A STREP BY PCR: Group A Strep by PCR: NOT DETECTED

## 2024-01-04 MED ORDER — DEXAMETHASONE 10 MG/ML FOR PEDIATRIC ORAL USE
10.0000 mg | Freq: Once | INTRAMUSCULAR | Status: AC
  Administered 2024-01-04: 10 mg via ORAL
  Filled 2024-01-04: qty 1

## 2024-01-04 MED ORDER — METHYLPREDNISOLONE 4 MG PO TBPK
ORAL_TABLET | ORAL | 0 refills | Status: DC
  Filled 2024-01-04: qty 21, 21d supply, fill #0

## 2024-01-04 MED ORDER — LIDOCAINE VISCOUS HCL 2 % MT SOLN
15.0000 mL | Freq: Once | OROMUCOSAL | Status: AC
  Administered 2024-01-04: 15 mL via OROMUCOSAL
  Filled 2024-01-04: qty 15

## 2024-01-04 MED ORDER — CHLORASEPTIC 6-10 MG MT LOZG
1.0000 | LOZENGE | OROMUCOSAL | 0 refills | Status: DC | PRN
  Filled 2024-01-04: qty 20, fill #0

## 2024-01-04 MED ORDER — KETOROLAC TROMETHAMINE 15 MG/ML IJ SOLN
15.0000 mg | Freq: Once | INTRAMUSCULAR | Status: AC
  Administered 2024-01-04: 15 mg via INTRAMUSCULAR
  Filled 2024-01-04: qty 1

## 2024-01-04 NOTE — ED Triage Notes (Signed)
 C/o sorethroat onset earlier in the week was seen at [facility] on Wed and was given antibiotics, states his throat is still  hurting and he aslo wants to be checked for STD.

## 2024-01-04 NOTE — Telephone Encounter (Signed)
 Patient called in to ask to switch Medrol dosepak to [facility] on [address]. Called [address] [facility] and had it switched as written
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
## 2024-01-06 ENCOUNTER — Other Ambulatory Visit: Payer: Self-pay

## 2024-01-06 LAB — GC/CHLAMYDIA PROBE AMP (~~LOC~~) NOT AT ARMC
Chlamydia: NEGATIVE
Chlamydia: POSITIVE — AB
Comment: NEGATIVE
Comment: NEGATIVE
Comment: NORMAL
Comment: NORMAL
Neisseria Gonorrhea: NEGATIVE
Neisseria Gonorrhea: NEGATIVE

## 2024-01-06 NOTE — Telephone Encounter (Signed)
Attempted to contact patient and VM is currently full.

## 2024-01-06 NOTE — Addendum Note (Signed)
 Addended by: Hoy Register on: 01/06/2024 08:53 AM   Modules accepted: Orders

## 2024-01-06 NOTE — Telephone Encounter (Signed)
 Referral has been placed.

## 2024-01-07 ENCOUNTER — Telehealth: Payer: Self-pay

## 2024-01-07 ENCOUNTER — Other Ambulatory Visit: Payer: Self-pay

## 2024-01-07 NOTE — Telephone Encounter (Signed)
 Received inbound call from patient who reports he has mistakenly thrown his medications away and needs his amoxicillin and throat lozengers called in. Patient reports he will not file with insurance as he will pay out of pocket for his medications. Per chart review patient was seen by [facility] UC on 3/12 for Amoxicillin. Patient was seen at [facility] ED on 01/04/24 with the following Rx prescribed: chloraseptic and medrol dosepak.    This RNCM spoke with [facility] pharmacy who reports the following medications are ready for pick up: chloraseptic and medrol dosepak. Per pharmacy patient has picked up one pill of Flagyl and the Amoxicillin.

## 2024-01-07 NOTE — Telephone Encounter (Signed)
 This RNCM called patient at [phone] to advise his medications were not picked up by him. [facility] pharmacy reports medications are ready for pick up. This RNCM unable to leave a voicemail due to voicemail box has not been set up.   No additional TOC needs

## 2024-01-09 ENCOUNTER — Encounter (INDEPENDENT_AMBULATORY_CARE_PROVIDER_SITE_OTHER): Payer: Self-pay

## 2024-01-09 ENCOUNTER — Ambulatory Visit (INDEPENDENT_AMBULATORY_CARE_PROVIDER_SITE_OTHER): Admitting: Otolaryngology

## 2024-01-09 ENCOUNTER — Other Ambulatory Visit: Payer: Self-pay

## 2024-01-09 VITALS — BP 135/79 | HR 57 | Ht 69.0 in | Wt 190.0 lb

## 2024-01-09 DIAGNOSIS — J029 Acute pharyngitis, unspecified: Secondary | ICD-10-CM

## 2024-01-09 DIAGNOSIS — R490 Dysphonia: Secondary | ICD-10-CM | POA: Diagnosis not present

## 2024-01-09 DIAGNOSIS — R09A2 Foreign body sensation, throat: Secondary | ICD-10-CM | POA: Diagnosis not present

## 2024-01-09 MED ORDER — PREDNISONE 10 MG PO TABS
10.0000 mg | ORAL_TABLET | Freq: Every day | ORAL | 0 refills | Status: DC
  Filled 2024-01-09: qty 7, 7d supply, fill #0

## 2024-01-09 MED ORDER — AMOXICILLIN-POT CLAVULANATE 875-125 MG PO TABS
1.0000 | ORAL_TABLET | Freq: Two times a day (BID) | ORAL | 0 refills | Status: AC
  Filled 2024-01-09: qty 20, 10d supply, fill #0
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
## 2024-01-09 NOTE — Patient Instructions (Signed)
 Take Augmentin 875 mg by mouth (PO) twice daily for 7 days; take with food, take probiotic or yogurt with it Take Prednisone 10mg  by mouth for 7 days (1 pill), then stop. Risks discussed

## 2024-01-16 ENCOUNTER — Ambulatory Visit: Attending: Physician Assistant | Admitting: Physician Assistant

## 2024-01-16 ENCOUNTER — Other Ambulatory Visit: Payer: Self-pay

## 2024-01-16 ENCOUNTER — Encounter: Payer: Self-pay | Admitting: Physician Assistant

## 2024-01-16 VITALS — BP 119/75 | HR 56 | Resp 19 | Ht 69.0 in | Wt 202.4 lb

## 2024-01-16 DIAGNOSIS — Z09 Encounter for follow-up examination after completed treatment for conditions other than malignant neoplasm: Secondary | ICD-10-CM

## 2024-01-16 DIAGNOSIS — A749 Chlamydial infection, unspecified: Secondary | ICD-10-CM | POA: Diagnosis not present

## 2024-01-16 DIAGNOSIS — R070 Pain in throat: Secondary | ICD-10-CM

## 2024-01-16 MED ORDER — AZITHROMYCIN 250 MG PO TABS
1000.0000 mg | ORAL_TABLET | Freq: Every day | ORAL | 0 refills | Status: DC
  Filled 2024-01-16: qty 4, 1d supply, fill #0

## 2024-01-16 NOTE — Patient Instructions (Signed)
Chlamydia, Male Chlamydia is a sexually transmitted infection (STI). This infection spreads through sexual contact. Chlamydia can occur in different areas of the body, including: The urethra. This is the part of the body that drains pee (urine) from the bladder and through the penis. The throat. The opening of the butt (rectum). This condition is not hard to treat. But if it is not treated, it can cause worse health problems. What are the causes? This condition is caused by a germ (bacteria) called Chlamydia trachomatis. These germs are spread from an infected partner during sex. The infection can spread through contact with the genitals, mouth, or the opening of the butt. What increases the risk? Not using a condom the right way. Not using a condom every time you have sex. Having a new sex partner. Having more than one sex partner. Being a man who has sex with men. Being sexually active before age 78. What are the signs or symptoms? In some cases, there are no symptoms, especially early in the illness. If you get symptoms, they may include: Peeing often, or a burning feeling when you pee. Pain or swelling in the testicles. Watery, mucus-like fluid (discharge) from the penis. Redness, soreness, swelling, or bleeding, from the butt. Fluid coming from the penis or butt. Pain in the belly (abdomen). Pain during sex. How is this treated? This condition is treated with antibiotic medicines. Follow these instructions at home: Sexual activity Tell your sex partner or partners about your infection. Sex partners are people you had oral, anal, or vaginal sex with within 60 days of when you started getting sick. They need treatment even if they do not feel or seem sick. Do not have sex until: You and your sex partners have been treated. Your doctor says it is okay. If you get just one dose of medicine, wait at least 7 days before having sex. General instructions Take over-the-counter and  prescription medicines as told by your doctor. Finish your antibiotics even if you start to feel better. It is up to you to get your test results. Ask how to get your results when they are ready. Keep all follow-up visits. You may need tests after 3 months. How is this prevented? To lower your risk: Use latex or polyurethane condoms the right way. Do this every time you have sex. Do not have many sex partners. Ask if your sex partner got tested for STIs and had negative results. Get regular health screenings to check for STIs. Contact a doctor if: You get new symptoms. Your symptoms are getting worse or do not get better with treatment. You have a fever or chills. You have pain during sex. You have pain or soreness in your testicles. You get flu-like symptoms. These may be: Night sweats. Sore throat. Muscle aches. You are not able to take your antibiotic as told. Summary Chlamydia is an infection that spreads through sexual contact. This condition is treated with antibiotics. If it is not treated, it can cause health problems. Your sex partners will also need to be treated. Do not have sex until both you and your partner have been treated. Take all medicines as told and keep all follow-up visits. This information is not intended to replace advice given to you by your health care provider. Make sure you discuss any questions you have with your health care provider. Document Revised: 07/17/2021 Document Reviewed: 07/17/2021 Elsevier Patient Education  2024 [employer].
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
## 2024-01-22 ENCOUNTER — Inpatient Hospital Stay: Admitting: Physician Assistant

## 2024-02-20 ENCOUNTER — Encounter: Payer: Self-pay | Admitting: Genetic Counselor

## 2024-02-20 ENCOUNTER — Telehealth: Payer: Self-pay | Admitting: Genetic Counselor

## 2024-02-20 NOTE — Telephone Encounter (Signed)
 Patient called back.  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  Patient was scheduled for a results discussion on 5/6 @ 2 PM.

## 2024-02-20 NOTE — Telephone Encounter (Addendum)
 Unable to leave message on VM as VM was full.  I sent a letter and patient message through the patient portal will also put the letter in the mail.

## 2024-02-25 ENCOUNTER — Inpatient Hospital Stay: Attending: Genetic Counselor | Admitting: Genetic Counselor

## 2024-02-25 DIAGNOSIS — Z1509 Genetic susceptibility to other malignant neoplasm: Secondary | ICD-10-CM | POA: Diagnosis not present

## 2024-02-25 DIAGNOSIS — Z1379 Encounter for other screening for genetic and chromosomal anomalies: Secondary | ICD-10-CM | POA: Diagnosis not present

## 2024-02-25 DIAGNOSIS — Z803 Family history of malignant neoplasm of breast: Secondary | ICD-10-CM | POA: Diagnosis not present

## 2024-02-25 DIAGNOSIS — Z1501 Genetic susceptibility to malignant neoplasm of breast: Secondary | ICD-10-CM | POA: Diagnosis not present

## 2024-02-25 DIAGNOSIS — Z801 Family history of malignant neoplasm of trachea, bronchus and lung: Secondary | ICD-10-CM

## 2024-02-25 NOTE — Progress Notes (Signed)
 GENETIC TEST RESULTS   Patient Name: Trevor Bean Patient Age: 58 y.o. Encounter Date: 02/25/2024  Referring Provider: Joaquin Mulberry, MD    Mr. Lottman was seen in the Cancer Genetics clinic to review his genetic test results from 2023.  FAMILY HISTORY:  We obtained a detailed, 4-generation family history.  The patient reported the following information about his family history.  Significant diagnoses are listed below: Family History  Problem Relation Age of Onset   Hypertension Mother    Diabetes Mother    Breast cancer Father 22   Hypertension Father    Ovarian cancer Sister 7   Other Sister        murdered   Lung cancer Brother 50   Heart attack Maternal Grandmother    Stroke Maternal Grandfather    Lung cancer Paternal Grandmother    Other Paternal Grandfather        old age   Breast cancer Daughter 58   Prostate cancer Maternal Uncle    Prostate cancer Maternal Uncle    Prostate cancer Maternal Uncle    Breast cancer Paternal Aunt    Lung cancer Paternal Uncle        The patient had one daughter who died of breast cancer at age 53.  He has two full sisters and a maternal half brother.  One sister has been diagnosed with either cervical or ovarian cancer.  The other sister was murdered at age 39.  He has a maternal half brother who developed lung cancer and passed away at 78.   His mother is living and his father is deceased.   The patient's father was diagnosed with breast cancer.  He has three sisters and four brothers.  One sister had breast cancer.  The paternal grandparents are deceased.   The patient's mother has heart disease.  She has three brothers who have prostate cancer and three sisters who are cancer free.  The maternal grandparents are deceased.  The grandfather has a sister who had breast cancer and the grandmother has a niece who had breast cancer.   Mr. Hitsman is unaware of previous family history of genetic testing for hereditary cancer risks.  Patient's maternal ancestors are of African American descent, and paternal ancestors are of African American descent. There is no reported Ashkenazi Jewish ancestry. There is no known consanguinity  GENETIC TESTING:   Mr. Seepersad tested positive for a single pathogenic variant in the BRCA1 gene. Specifically, this variant is [id number].E1535* (c.4603G>T).  The test report has been scanned into EPIC and is located under the Molecular Pathology section of the Results Review tab.  A portion of the result report is included below for reference. Genetic testing reported out on May 14, 2022.     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  At this time, it is unknown if this variant is associated with increased cancer risk or if this is a normal finding, but most variants such as this get reclassified to being inconsequential. It should not be used to make medical management decisions. With time, we suspect the lab will determine the significance of this variant, if any. If we do learn more about it, we will try to contact Mr. Merrit to discuss it further. However, it is important to stay in touch with us  periodically and keep the address and phone number up to date.  Clinical Information: Hereditary breast and ovarian cancer (HBOC) syndrome  is characterized by an increased lifetime risk for, generally, adult-onset cancers including, breast, contralateral breast, male breast, ovarian, prostate, melanoma and pancreatic.  The cancers associated with BRCA1 are:  Male breast cancer, 60-72% risk Up to 73% of the breast cancers diagnosed in women with BRCA1 mutations are triple negative breast cancer In women with a history of breast cancer, the cumulative risk for contralateral breast cancer 5 years after breast cancer diagnosis is 15%. The cumulative 20- year risk is 40% or greater. Male breast cancer, up to a 2% risk Ovarian  cancer, up to a 54% risk Pancreatic cancer, 1-3% risk Prostate cancer, elevated risk BRCA1 also has preliminary evidence for an association with melanoma Limited data suggest there may be a slightly increased risk of serous uterine cancer    Management Recommendations:  Breast Screening/Risk Reduction:  Women: Breast cancer screening includes: Breast awareness beginning at age 24 Monthly self-breast examination beginning at age 70 Clinical breast examination every 6-12 months beginning at age 34 or at the age of the earliest diagnosed breast cancer in the family, if onset was before age 13 Annual breast MRI with contrast beginning at age 43-29 (or annual mammograms with consideration of tomosynthesis if MRI is unavailable), although the age to initiate screening may be individualized based on family history Annual mammogram beginning at age 24 until age 31 with consideration of tomosynthesis with continuation of annual breast MRI with contrast The option of prophylactic bilateral risk-reducing mastectomy (RRM), removal of the breast tissue before cancer develops, is the best option for significantly decreasing the risk of developing breast cancer. Studies have shown mastectomies reduce the risk of breast cancer by 90-95% in women with a BRCA1 mutation. Breast reconstruction can also be performed immediately following the mastectomy, depending on the type of reconstruction chosen. For women with a BRCA1 pathogenic or likely pathogenic variant who are treated for breast cancer and have not had a bilateral mastectomy, screening with annual mammogram with consideration of tomosynthesis and breast MRI should continue as described above.  Males: Breast self-exam training and education starting at age 34 years Annual clinical breast exam starting at age 39 years  Consider annual mammogram starting at age 28 or 10 years before the earliest known male breast cancer in the family (whichever comes  first).   Gynecological Cancer Screening/Risk Reduction: It is recommended that women with a BRCA1 mutation consider having a risk-reducing salpingo oophorectomy (RRSO), removal of the ovaries and fallopian tubes, between the ages of 60-40 or once childbearing is completed. Having a RRSO is estimated to reduce the risk of ovarian cancer by up to 96%. There is still a small risk of developing an "ovarian-like" cancer in the lining of the abdomen, called the peritoneum. Another benefit to having the ovaries removed is the risk reduction for breast cancer. If the ovaries are removed before menopause, the risk of developing breast cancer is reduced. Women undergoing a RRSO should be aware of the potential risks and benefits of concurrent hysterectomy. Hormone replacement therapy could be considered based on the physician's discretion. Individuals at risk for developing breast and ovarian cancer may benefit from the use of medication to reduce their risk for cancer. These medications are referred to as chemoprevention. For example, oral contraceptive use has been shown to reduce the risk of ovarian cancer by approximately 60% in BRCA1 mutation carriers if taken for at least 5 years. This risk reduction remains even after discontinuation of oral contraceptives. Ovarian cancer screening is an option for women  who chose not to have a RRSO or who, as of yet, have not completed their family. Current screening methods for ovarian cancer are neither sensitive nor specific, meaning that often early stage ovarian cancer cannot be diagnosed through this screening.  Screening can also be falsely positive with no cancer present. For this reason, RRSO is recommended over screening. If ovarian cancer screening is recommended by your physician, it could include: CA-125 blood tests Transvaginal ultrasounds Clinical pelvic exams   Skin Cancer Screening and Risk Reduction: Regular skin self-examinations Individuals should  notify their physicians of any changes to moles such as increasing in size, darkening in color, or other change in appearance. Annual skin examinations by a dermatologist  Follow sun-safety recommendations such as: Using UVA and UVB 30 SPF or higher sunscreen Avoiding sunburns Limiting sun exposure, especially during the hours of 11am-4pm  Wearing protective clothing and sunglasses Avoid using tanning beds For more information about the prevention of melanoma visit [email or web address]   Prostate Cancer Screening: Annual digital rectal exam (DRE) at age 22 Annual PSA blood test at age 2  Pancreatic Cancer Screening/Risk Reduction: Avoid smoking, heavy alcohol use, and obesity. It has been suggested that pancreatic cancer screening be limited to those with a family history of pancreatic cancer (first- or second-degree relative). Ideally, screening should be performed in experienced centers utilizing a multidisciplinary approach under research conditions. Recommended screening could include annual endoscopic ultrasound (preferred) and/or MRI of the pancreas starting at age 31 or 57 years younger than the earliest age diagnosis in the family. CA19-9 testing may be considered based on the physician's discretion.  Additional Considerations: Recent studies have suggested PARP inhibitors may be a beneficial chemotherapeutic agent for a subset of patients with BRCA1-associated breast, ovarian, prostate, and pancreatic cancers. Clinical trials are currently in process to determine if and how these agents can be useful in the treatment of BRCA1 cancer patients. Patients of reproductive age should be made aware of options for prenatal diagnosis and assisted reproduction including pre-implantation genetic diagnosis.   This information is based on current understanding of the gene and may change in the future.   Implications for Family Members: Hereditary predisposition to cancer due to pathogenic  variants in the BRCA1 gene has autosomal dominant inheritance. This means that an individual with a pathogenic variant has a 50% chance of passing the condition on to his/her offspring. Identification of a pathogenic variant allows for the recognition of at-risk relatives who can pursue testing for the familial variant.  The patient's daughter died of breast cancer at age 30. She has two children, a son and daughter.  Her daughter is 84, soon to be 27, and her son is 16.  We discussed the importance of testing for the patient's granddaughter, especially in light of his daughter's very young breast cancer diagnosis.  He voiced his understanding and that he will discuss this with her as well as her grandmother.  We discussed that this variant could be coming from either side of his family, but more likely his father's side since his father had male breast cancer.  We discussed cascade testing through the family, including his mother to confirm if this variant is from her side.    Family members are encouraged to consider genetic testing for this familial pathogenic variant. As there are generally no childhood cancer risks associated with pathogenic variants in the BRCA1 gene, individuals in the family are not recommended to have testing until they reach at least 57 years of  age. They may contact our office at [phone] for more information or to schedule an appointment.  Complimentary testing for the familial variant is available for 90 days.  Family members who live outside of the area are encouraged to find a genetic counselor in their area by visiting: [email or web address].  Resources: FORCE (Facing Our Risk of Cancer Empowered) is a resource for those with a hereditary predisposition to develop cancer.  FORCE provides information about risk reduction, advocacy, legislation, and clinical trials.  Additionally, FORCE provides a platform for collaboration and support; which includes:  peer navigation, message boards, local support groups, a toll-free helpline, research registry and recruitment, advocate training, published medical research, webinars, brochures, mastectomy photos, and more.  For more information, visit www.facingourrisk.org  We encouraged Mr. Woolson to remain in contact with us  on an annual basis so we can update his personal and family histories, and let him know of advances in cancer genetics that may benefit the family. Our contact number was provided. Mr. Kopacz questions were answered to his satisfaction today, and he knows he is welcome to call anytime with additional questions.   Barnett Elzey P. [place], MS, [facility] Licensed, Patent attorney [email or web address] phone: [phone]  30 minutes were spent on the date of the encounter in service to the patient including preparation, face-to-face consultation, documentation and care coordination.

## 2024-03-24 ENCOUNTER — Ambulatory Visit: Payer: 59 | Attending: Family Medicine | Admitting: Family Medicine

## 2024-03-24 ENCOUNTER — Other Ambulatory Visit: Payer: Self-pay

## 2024-03-24 ENCOUNTER — Encounter: Payer: Self-pay | Admitting: Family Medicine

## 2024-03-24 VITALS — BP 124/77 | HR 73 | Ht 69.0 in | Wt 197.2 lb

## 2024-03-24 DIAGNOSIS — E785 Hyperlipidemia, unspecified: Secondary | ICD-10-CM | POA: Diagnosis not present

## 2024-03-24 DIAGNOSIS — M2550 Pain in unspecified joint: Secondary | ICD-10-CM

## 2024-03-24 DIAGNOSIS — A749 Chlamydial infection, unspecified: Secondary | ICD-10-CM | POA: Diagnosis not present

## 2024-03-24 DIAGNOSIS — R0989 Other specified symptoms and signs involving the circulatory and respiratory systems: Secondary | ICD-10-CM | POA: Diagnosis not present

## 2024-03-24 MED ORDER — ATORVASTATIN CALCIUM 20 MG PO TABS
20.0000 mg | ORAL_TABLET | Freq: Every day | ORAL | 1 refills | Status: AC
Start: 2024-03-24 — End: ?
  Filled 2024-03-24: qty 90, 90d supply, fill #0

## 2024-03-24 MED ORDER — AZITHROMYCIN 250 MG PO TABS
1000.0000 mg | ORAL_TABLET | Freq: Every day | ORAL | 0 refills | Status: DC
  Filled 2024-03-24: qty 4, 1d supply, fill #0

## 2024-03-24 MED ORDER — GUAIFENESIN ER 600 MG PO TB12
600.0000 mg | ORAL_TABLET | Freq: Two times a day (BID) | ORAL | 1 refills | Status: DC
  Filled 2024-03-24: qty 60, 30d supply, fill #0
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
## 2024-03-24 NOTE — Patient Instructions (Signed)
 VISIT SUMMARY:  Today, you came in for a follow-up on genetic counseling and management of arthritis and chlamydia. We discussed your concerns about lung cancer risk, your need for chlamydia treatment, arthritis pain management, respiratory symptoms, and cholesterol levels.  YOUR PLAN:  -CHLAMYDIA: Chlamydia is a common sexually transmitted infection that can be treated with antibiotics. You will be prescribed azithromycin  to treat this infection.  -POLYARTHRALGIA SECONDARY TO ARTHRITIS: Arthritis is a condition that causes chronic joint pain and stiffness. You will be referred to a rheumatologist for specialized care, and you should stop taking ibuprofen  due to its side effects.  -RESPIRATORY SYMPTOMS: Your chronic phlegm production may be related to cannabis use. You will be prescribed Mucinex  to help with the phlegm and should continue taking Zyrtec daily for your nasal symptoms.  -HYPERLIPIDEMIA: Hyperlipidemia means having high cholesterol levels. You will continue taking atorvastatin  at night and have a fasting lipid panel to check your cholesterol levels.  -GENETIC COUNSELING FOR CANCER RISK: Given your family history of lung cancer, you are concerned about your genetic risk. We will facilitate an appointment for further genetic counseling and testing.  INSTRUCTIONS:  Please schedule your fasting blood work for tomorrow and ensure that the rheumatologist contacts you for a consultation.

## 2024-03-25 ENCOUNTER — Other Ambulatory Visit: Payer: Self-pay

## 2024-03-25 ENCOUNTER — Ambulatory Visit: Attending: Family Medicine

## 2024-03-25 DIAGNOSIS — M2550 Pain in unspecified joint: Secondary | ICD-10-CM

## 2024-03-25 DIAGNOSIS — E785 Hyperlipidemia, unspecified: Secondary | ICD-10-CM
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
## 2024-03-26 ENCOUNTER — Ambulatory Visit: Payer: Self-pay | Admitting: Family Medicine

## 2024-04-01 LAB — CMP14+EGFR
ALT: 33 IU/L (ref 0–44)
AST: 38 IU/L (ref 0–40)
Albumin: 4 g/dL (ref 3.8–4.9)
Alkaline Phosphatase: 83 IU/L (ref 44–121)
BUN/Creatinine Ratio: 15 (ref 9–20)
BUN: 12 mg/dL (ref 6–24)
Bilirubin Total: <0.2 mg/dL (ref 0.0–1.2)
CO2: 19 mmol/L — ABNORMAL LOW (ref 20–29)
Calcium: 8.8 mg/dL (ref 8.7–10.2)
Chloride: 107 mmol/L — ABNORMAL HIGH (ref 96–106)
Creatinine, Ser: 0.81 mg/dL (ref 0.76–1.27)
Globulin, Total: 2.8 g/dL (ref 1.5–4.5)
Glucose: 99 mg/dL (ref 70–99)
Potassium: 3.8 mmol/L (ref 3.5–5.2)
Sodium: 141 mmol/L (ref 134–144)
Total Protein: 6.8 g/dL (ref 6.0–8.5)
eGFR: 103 mL/min/{1.73_m2} (ref >59)

## 2024-04-01 LAB — CBC WITH DIFFERENTIAL/PLATELET
Basophils Absolute: 0 10*3/uL (ref 0.0–0.2)
Basos: 1 %
EOS (ABSOLUTE): 0.4 10*3/uL (ref 0.0–0.4)
Eos: 7 %
Hematocrit: 37.1 % — ABNORMAL LOW (ref 37.5–51.0)
Hemoglobin: 11.9 g/dL — ABNORMAL LOW (ref 13.0–17.7)
Immature Grans (Abs): 0 10*3/uL (ref 0.0–0.1)
Immature Granulocytes: 0 %
Lymphocytes Absolute: 2.1 10*3/uL (ref 0.7–3.1)
Lymphs: 35 %
MCH: 24.6 pg — ABNORMAL LOW (ref 26.6–33.0)
MCHC: 32.1 g/dL (ref 31.5–35.7)
MCV: 77 fL — ABNORMAL LOW (ref 79–97)
Monocytes Absolute: 0.6 10*3/uL (ref 0.1–0.9)
Monocytes: 11 %
Neutrophils Absolute: 2.9 10*3/uL (ref 1.4–7.0)
Neutrophils: 46 %
Platelets: 225 10*3/uL (ref 150–450)
RBC: 4.83 x10E6/uL (ref 4.14–5.80)
RDW: 15 % (ref 11.6–15.4)
WBC: 6.1 10*3/uL (ref 3.4–10.8)

## 2024-04-01 LAB — LP+NON-HDL CHOLESTEROL
Cholesterol, Total: 161 mg/dL (ref 100–199)
HDL: 43 mg/dL (ref >39)
LDL Chol Calc (NIH): 108 mg/dL — ABNORMAL HIGH (ref 0–99)
Total Non-HDL-Chol (LDL+VLDL): 118 mg/dL (ref 0–129)
Triglycerides: 45 mg/dL (ref 0–149)
VLDL Cholesterol Cal: 10 mg/dL (ref 5–40)

## 2024-04-01 LAB — HLA-B27 ANTIGEN: HLA B27: NEGATIVE

## 2024-04-07 ENCOUNTER — Ambulatory Visit (INDEPENDENT_AMBULATORY_CARE_PROVIDER_SITE_OTHER)

## 2024-04-07 ENCOUNTER — Ambulatory Visit: Attending: Rheumatology | Admitting: Rheumatology

## 2024-04-07 ENCOUNTER — Encounter: Payer: Self-pay | Admitting: Rheumatology

## 2024-04-07 VITALS — BP 119/76 | HR 56 | Ht 69.0 in | Wt 197.0 lb

## 2024-04-07 DIAGNOSIS — Z8042 Family history of malignant neoplasm of prostate: Secondary | ICD-10-CM

## 2024-04-07 DIAGNOSIS — M545 Low back pain, unspecified: Secondary | ICD-10-CM | POA: Diagnosis not present

## 2024-04-07 DIAGNOSIS — G8929 Other chronic pain: Secondary | ICD-10-CM

## 2024-04-07 DIAGNOSIS — M25512 Pain in left shoulder: Secondary | ICD-10-CM

## 2024-04-07 DIAGNOSIS — M19041 Primary osteoarthritis, right hand: Secondary | ICD-10-CM | POA: Diagnosis not present

## 2024-04-07 DIAGNOSIS — R21 Rash and other nonspecific skin eruption: Secondary | ICD-10-CM

## 2024-04-07 DIAGNOSIS — E785 Hyperlipidemia, unspecified: Secondary | ICD-10-CM

## 2024-04-07 DIAGNOSIS — M19071 Primary osteoarthritis, right ankle and foot: Secondary | ICD-10-CM

## 2024-04-07 DIAGNOSIS — G894 Chronic pain syndrome: Secondary | ICD-10-CM

## 2024-04-07 DIAGNOSIS — M5030 Other cervical disc degeneration, unspecified cervical region: Secondary | ICD-10-CM

## 2024-04-07 DIAGNOSIS — M170 Bilateral primary osteoarthritis of knee: Secondary | ICD-10-CM

## 2024-04-07 DIAGNOSIS — M19042 Primary osteoarthritis, left hand: Secondary | ICD-10-CM

## 2024-04-07 DIAGNOSIS — Z803 Family history of malignant neoplasm of breast: Secondary | ICD-10-CM

## 2024-04-07 DIAGNOSIS — M19072 Primary osteoarthritis, left ankle and foot: Secondary | ICD-10-CM

## 2024-04-07 DIAGNOSIS — M7061 Trochanteric bursitis, right hip: Secondary | ICD-10-CM

## 2024-04-07 NOTE — Patient Instructions (Addendum)
 Hand Exercises Hand exercises can be helpful for almost anyone. They can strengthen your hands and improve flexibility and movement. The exercises can also increase blood flow to the hands. These results can make your work and daily tasks easier for you. Hand exercises can be especially helpful for people who have joint pain from arthritis or nerve damage from using their hands over and over. These exercises can also help people who injure a hand. Exercises Most of these hand exercises are gentle stretching and motion exercises. It is usually safe to do them often throughout the day. Warming up your hands before exercise may help reduce stiffness. You can do this with gentle massage or by placing your hands in warm water for 10-15 minutes. It is normal to feel some stretching, pulling, tightness, or mild discomfort when you begin new exercises. In time, this will improve. Remember to always be careful and stop right away if you feel sudden, very bad pain or your pain gets worse. You want to get better and be safe. Ask your health care provider which exercises are safe for you. Do exercises exactly as told by your provider and adjust them as told. Do not begin these exercises until told by your provider. Knuckle bend or "claw" fist  Stand or sit with your arm, hand, and all five fingers pointed straight up. Make sure to keep your wrist straight. Gently bend your fingers down toward your palm until the tips of your fingers are touching your palm. Keep your big knuckle straight and only bend the small knuckles in your fingers. Hold this position for 10 seconds. Straighten your fingers back to your starting position. Repeat this exercise 5-10 times with each hand. Full finger fist  Stand or sit with your arm, hand, and all five fingers pointed straight up. Make sure to keep your wrist straight. Gently bend your fingers into your palm until the tips of your fingers are touching the middle of your  palm. Hold this position for 10 seconds. Extend your fingers back to your starting position, stretching every joint fully. Repeat this exercise 5-10 times with each hand. Straight fist  Stand or sit with your arm, hand, and all five fingers pointed straight up. Make sure to keep your wrist straight. Gently bend your fingers at the big knuckle, where your fingers meet your hand, and at the middle knuckle. Keep the knuckle at the tips of your fingers straight and try to touch the bottom of your palm. Hold this position for 10 seconds. Extend your fingers back to your starting position, stretching every joint fully. Repeat this exercise 5-10 times with each hand. Tabletop  Stand or sit with your arm, hand, and all five fingers pointed straight up. Make sure to keep your wrist straight. Gently bend your fingers at the big knuckle, where your fingers meet your hand, as far down as you can. Keep the small knuckles in your fingers straight. Think of forming a tabletop with your fingers. Hold this position for 10 seconds. Extend your fingers back to your starting position, stretching every joint fully. Repeat this exercise 5-10 times with each hand. Finger spread  Place your hand flat on a table with your palm facing down. Make sure your wrist stays straight. Spread your fingers and thumb apart from each other as far as you can until you feel a gentle stretch. Hold this position for 10 seconds. Bring your fingers and thumb tight together again. Hold this position for 10 seconds. Repeat  this exercise 5-10 times with each hand. Making circles  Stand or sit with your arm, hand, and all five fingers pointed straight up. Make sure to keep your wrist straight. Make a circle by touching the tip of your thumb to the tip of your index finger. Hold for 10 seconds. Then open your hand wide. Repeat this motion with your thumb and each of your fingers. Repeat this exercise 5-10 times with each hand. Thumb  motion  Sit with your forearm resting on a table and your wrist straight. Your thumb should be facing up toward the ceiling. Keep your fingers relaxed as you move your thumb. Lift your thumb up as high as you can toward the ceiling. Hold for 10 seconds. Bend your thumb across your palm as far as you can, reaching the tip of your thumb for the small finger (pinkie) side of your palm. Hold for 10 seconds. Repeat this exercise 5-10 times with each hand. Grip strengthening  Hold a stress ball or other soft ball in the middle of your hand. Slowly increase the pressure, squeezing the ball as much as you can without causing pain. Think of bringing the tips of your fingers into the middle of your palm. All of your finger joints should bend when doing this exercise. Hold your squeeze for 10 seconds, then relax. Repeat this exercise 5-10 times with each hand. Contact a health care provider if: Your hand pain or discomfort gets much worse when you do an exercise. Your hand pain or discomfort does not improve within 2 hours after you exercise. If you have either of these problems, stop doing these exercises right away. Do not do them again unless your provider says that you can. Get help right away if: You develop sudden, severe hand pain or swelling. If this happens, stop doing these exercises right away. Do not do them again unless your provider says that you can. This information is not intended to replace advice given to you by your health care provider. Make sure you discuss any questions you have with your health care provider. Document Revised: 10/23/2022 Document Reviewed: 10/23/2022 Elsevier Patient Education  2024 [employer] Low Back Sprain or Strain Rehab Ask your health care provider which exercises are safe for you. Do exercises exactly as told by your health care provider and adjust them as directed. It is normal to feel mild stretching, pulling, tightness, or discomfort as you do these  exercises. Stop right away if you feel sudden pain or your pain gets worse. Do not begin these exercises until told by your health care provider. Stretching and range-of-motion exercises These exercises warm up your muscles and joints and improve the movement and flexibility of your back. These exercises also help to relieve pain, numbness, and tingling. Lumbar rotation  Lie on your back on a firm bed or the floor with your knees bent. Straighten your arms out to your sides so each arm forms a 90-degree angle (right angle) with a side of your body. Slowly move (rotate) both of your knees to one side of your body until you feel a stretch in your lower back (lumbar). Try not to let your shoulders lift off the floor. Hold this position for __________ seconds. Tense your abdominal muscles and slowly move your knees back to the starting position. Repeat this exercise on the other side of your body. Repeat __________ times. Complete this exercise __________ times a day. Single knee to chest  Lie on your back on a  firm bed or the floor with both legs straight. Bend one of your knees. Use your hands to move your knee up toward your chest until you feel a gentle stretch in your lower back and buttock. Hold your leg in this position by holding on to the front of your knee. Keep your other leg as straight as possible. Hold this position for __________ seconds. Slowly return to the starting position. Repeat with your other leg. Repeat __________ times. Complete this exercise __________ times a day. Prone extension on elbows  Lie on your abdomen on a firm bed or the floor (prone position). Prop yourself up on your elbows. Use your arms to help lift your chest up until you feel a gentle stretch in your abdomen and your lower back. This will place some of your body weight on your elbows. If this is uncomfortable, try stacking pillows under your chest. Your hips should stay down, against the surface that  you are lying on. Keep your hip and back muscles relaxed. Hold this position for __________ seconds. Slowly relax your upper body and return to the starting position. Repeat __________ times. Complete this exercise __________ times a day. Strengthening exercises These exercises build strength and endurance in your back. Endurance is the ability to use your muscles for a long time, even after they get tired. Pelvic tilt This exercise strengthens the muscles that lie deep in the abdomen. Lie on your back on a firm bed or the floor with your legs extended. Bend your knees so they are pointing toward the ceiling and your feet are flat on the floor. Tighten your lower abdominal muscles to press your lower back against the floor. This motion will tilt your pelvis so your tailbone points up toward the ceiling instead of pointing to your feet or the floor. To help with this exercise, you may place a small towel under your lower back and try to push your back into the towel. Hold this position for __________ seconds. Let your muscles relax completely before you repeat this exercise. Repeat __________ times. Complete this exercise __________ times a day. Alternating arm and leg raises  Get on your hands and knees on a firm surface. If you are on a hard floor, you may want to use padding, such as an exercise mat, to cushion your knees. Line up your arms and legs. Your hands should be directly below your shoulders, and your knees should be directly below your hips. Lift your left leg behind you. At the same time, raise your right arm and straighten it in front of you. Do not lift your leg higher than your hip. Do not lift your arm higher than your shoulder. Keep your abdominal and back muscles tight. Keep your hips facing the ground. Do not arch your back. Keep your balance carefully, and do not hold your breath. Hold this position for __________ seconds. Slowly return to the starting  position. Repeat with your right leg and your left arm. Repeat __________ times. Complete this exercise __________ times a day. Abdominal set with straight leg raise  Lie on your back on a firm bed or the floor. Bend one of your knees and keep your other leg straight. Tense your abdominal muscles and lift your straight leg up, 4-6 inches (10-15 cm) off the ground. Keep your abdominal muscles tight and hold this position for __________ seconds. Do not hold your breath. Do not arch your back. Keep it flat against the ground. Keep your abdominal muscles  tense as you slowly lower your leg back to the starting position. Repeat with your other leg. Repeat __________ times. Complete this exercise __________ times a day. Single leg lower with bent knees Lie on your back on a firm bed or the floor. Tense your abdominal muscles and lift your feet off the floor, one foot at a time, so your knees and hips are bent in 90-degree angles (right angles). Your knees should be over your hips and your lower legs should be parallel to the floor. Keeping your abdominal muscles tense and your knee bent, slowly lower one of your legs so your toe touches the ground. Lift your leg back up to return to the starting position. Do not hold your breath. Do not let your back arch. Keep your back flat against the ground. Repeat with your other leg. Repeat __________ times. Complete this exercise __________ times a day. Posture and body mechanics Good posture and healthy body mechanics can help to relieve stress in your body's tissues and joints. Body mechanics refers to the movements and positions of your body while you do your daily activities. Posture is part of body mechanics. Good posture means: Your spine is in its natural S-curve position (neutral). Your shoulders are pulled back slightly. Your head is not tipped forward (neutral). Follow these guidelines to improve your posture and body mechanics in your everyday  activities. Standing  When standing, keep your spine neutral and your feet about hip-width apart. Keep a slight bend in your knees. Your ears, shoulders, and hips should line up. When you do a task in which you stand in one place for a long time, place one foot up on a stable object that is 2-4 inches (5-10 cm) high, such as a footstool. This helps keep your spine neutral. Sitting  When sitting, keep your spine neutral and keep your feet flat on the floor. Use a footrest, if necessary, and keep your thighs parallel to the floor. Avoid rounding your shoulders, and avoid tilting your head forward. When working at a desk or a computer, keep your desk at a height where your hands are slightly lower than your elbows. Slide your chair under your desk so you are close enough to maintain good posture. When working at a computer, place your monitor at a height where you are looking straight ahead and you do not have to tilt your head forward or downward to look at the screen. Resting When lying down and resting, avoid positions that are most painful for you. If you have pain with activities such as sitting, bending, stooping, or squatting, lie in a position in which your body does not bend very much. For example, avoid curling up on your side with your arms and knees near your chest (fetal position). If you have pain with activities such as standing for a long time or reaching with your arms, lie with your spine in a neutral position and bend your knees slightly. Try the following positions: Lying on your side with a pillow between your knees. Lying on your back with a pillow under your knees. Lifting  When lifting objects, keep your feet at least shoulder-width apart and tighten your abdominal muscles. Bend your knees and hips and keep your spine neutral. It is important to lift using the strength of your legs, not your back. Do not lock your knees straight out. Always ask for help to lift heavy or  awkward objects. This information is not intended to replace advice given  to you by your health care provider. Make sure you discuss any questions you have with your health care provider. Document Revised: 02/11/2023 Document Reviewed: 12/26/2020 Elsevier Patient Education  2024 [employer].

## 2024-04-09 ENCOUNTER — Ambulatory Visit: Payer: Self-pay

## 2024-04-09 NOTE — Telephone Encounter (Signed)
 FYI Only or Action Required?: Action required by provider: request for documentation or forms.  Patient was last seen in primary care on 03/24/2024 by Newlin, Enobong, MD. Called Nurse Triage reporting Advice Only. Symptoms began about a month ago. Symptoms are: stable.  Triage Disposition: Call PCP When Office is Open  Patient/caregiver understands and will follow disposition?: Yes          Copied from CRM [id number]. Topic: Clinical - Red Word Triage >> Apr 09, 2024  3:23 PM Marissa P wrote: Red Word that prompted transfer to Nurse Triage: Patient called and has pain in lower back, would like to see pcp as soon as possible and hopefully get a note for work as well please. Reason for Disposition  [1] Caller requesting NON-URGENT health information AND [2] PCP's office is the best resource  Answer Assessment - Initial Assessment Questions 1. REASON FOR CALL or QUESTION: What is your reason for calling today? or How can I best help you? or What question do you have that I can help answer?     Pt states that he seen the Rheumatologist and they did xrays on his back and ordered PT  for his back but it doesn't start until July 1st and Rheumatologist instructed him to contact his PCP to get a note to be off work for 2 weeks to give his back time to rest. Pt is requesting a work note to be off until July 1st as he does  a lot of standing. Please reach out to pt about work excuse  Protocols used: Information Only Call - No Triage-A-AH

## 2024-04-09 NOTE — Telephone Encounter (Signed)
Patient has been scheduled for a video visit.

## 2024-04-10 ENCOUNTER — Telehealth: Payer: Self-pay | Admitting: Family Medicine

## 2024-04-10 NOTE — Telephone Encounter (Addendum)
 Patient called Trevor Bean, I have confirmed with Trevor Bean about patient's appointment on 04/13/2024 at 8:10 am for Mychart video visit.

## 2024-04-10 NOTE — Telephone Encounter (Signed)
 Contacted pt left vm to confim appt! ( Sent out 3x text reminders)

## 2024-04-10 NOTE — Telephone Encounter (Unsigned)
 Copied from CRM [id number]. Topic: General - Other >> Apr 10, 2024  1:53 PM Sophia H wrote: Reason for CRM: Patient returning call to confirm upcoming virtual appointment.    Apr 13 2024 08:10 AM - MYCHART VIDEO VISIT [facility] Of [place]. [facility] - Joaquin Mulberry, MD

## 2024-04-13 ENCOUNTER — Telehealth (HOSPITAL_BASED_OUTPATIENT_CLINIC_OR_DEPARTMENT_OTHER): Admitting: Family Medicine

## 2024-04-13 ENCOUNTER — Other Ambulatory Visit: Payer: Self-pay | Admitting: *Deleted

## 2024-04-13 DIAGNOSIS — R7303 Prediabetes: Secondary | ICD-10-CM | POA: Diagnosis not present

## 2024-04-13 DIAGNOSIS — E785 Hyperlipidemia, unspecified: Secondary | ICD-10-CM

## 2024-04-13 DIAGNOSIS — G8929 Other chronic pain: Secondary | ICD-10-CM

## 2024-04-13 DIAGNOSIS — M549 Dorsalgia, unspecified: Secondary | ICD-10-CM | POA: Diagnosis not present
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
## 2024-04-13 NOTE — Patient Instructions (Signed)
 VISIT SUMMARY:  Trevor Bean, you visited today to discuss your back pain and obtain a note for time off work. We also reviewed your concerns about glucose levels, cholesterol, and mild anemia. Your recent blood work shows normal glucose levels, stable mild anemia, and normal kidney and liver functions. Your cholesterol has improved due to dietary changes.  YOUR PLAN:  -BACK PAIN: Back pain can result from muscle strain or other issues. You have been evaluated by a rheumatologist and started physical therapy on July 1st. Continue with physical therapy, and if you need a work note, please contact your rheumatologist.  -DYSLIPIDEMIA: Dyslipidemia means having abnormal cholesterol levels. Your cholesterol has improved to 161 mg/dL due to dietary changes. Continue with these dietary modifications, and we will monitor your cholesterol levels. If they increase, we may consider restarting atorvastatin .  -PREDIABETES: Prediabetes means your blood sugar levels are higher than normal but not high enough to be classified as diabetes. Your previous A1c was 5.8%. We will check your A1c at your next visit to monitor this condition.  -MILD ANEMIA: Mild anemia means having a lower than normal number of red blood cells. Your mild anemia is stable, and we will continue to monitor it in future lab tests.  -GENERAL HEALTH MAINTENANCE: Your kidney and liver functions are normal, and your autoimmune tests are negative. Continue with routine health maintenance and screenings.  INSTRUCTIONS:  Please continue with your physical therapy for back pain. If you need a work note, contact your rheumatologist. We will check your A1c at your next visit to monitor your prediabetes. Continue with dietary changes to manage your cholesterol, and we will monitor it in future visits. We will also keep an eye on your mild anemia in future lab tests.

## 2024-04-21 ENCOUNTER — Ambulatory Visit: Attending: Rheumatology

## 2024-04-21 DIAGNOSIS — M6281 Muscle weakness (generalized): Secondary | ICD-10-CM | POA: Diagnosis present

## 2024-04-21 DIAGNOSIS — M5459 Other low back pain: Secondary | ICD-10-CM | POA: Diagnosis present

## 2024-04-21 DIAGNOSIS — M19042 Primary osteoarthritis, left hand: Secondary | ICD-10-CM | POA: Insufficient documentation

## 2024-04-21 DIAGNOSIS — M545 Low back pain, unspecified: Secondary | ICD-10-CM | POA: Diagnosis not present

## 2024-04-21 DIAGNOSIS — M19041 Primary osteoarthritis, right hand: Secondary | ICD-10-CM | POA: Insufficient documentation

## 2024-04-21 DIAGNOSIS — G8929 Other chronic pain: Secondary | ICD-10-CM | POA: Insufficient documentation
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
## 2024-04-23 ENCOUNTER — Ambulatory Visit

## 2024-04-23 ENCOUNTER — Ambulatory Visit: Admitting: Internal Medicine

## 2024-04-23 DIAGNOSIS — M6281 Muscle weakness (generalized): Secondary | ICD-10-CM

## 2024-04-23 DIAGNOSIS — M5459 Other low back pain: Secondary | ICD-10-CM | POA: Diagnosis not present
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
## 2024-04-27 ENCOUNTER — Ambulatory Visit

## 2024-04-28 ENCOUNTER — Telehealth: Payer: Self-pay

## 2024-04-28 NOTE — Telephone Encounter (Signed)
 Patient was referred to physical therapy.  He would be coming only on as needed basis.  He should get a note from his primary care physician.

## 2024-04-28 NOTE — Telephone Encounter (Signed)
 Patient advised he was referred to physical therapy. He would be coming only on as needed basis. He should get a note from his primary care physician. Patient expressed understanding.

## 2024-04-28 NOTE — Telephone Encounter (Signed)
 Patient called the office stating he is missing a lot of work due to the back pain, he's been going to PT and has very minimal relief. Patient would like to know if he can have some type of note for work that can excuse him. Patients PT recommended him call the office to get this not since he is on his feet all day an the back pain some days in intolerable.

## 2024-04-30 ENCOUNTER — Ambulatory Visit: Admitting: Physical Medicine and Rehabilitation

## 2024-04-30 ENCOUNTER — Encounter: Payer: Self-pay | Admitting: Physical Medicine and Rehabilitation

## 2024-04-30 DIAGNOSIS — M47817 Spondylosis without myelopathy or radiculopathy, lumbosacral region: Secondary | ICD-10-CM

## 2024-04-30 DIAGNOSIS — M47816 Spondylosis without myelopathy or radiculopathy, lumbar region: Secondary | ICD-10-CM

## 2024-04-30 DIAGNOSIS — M545 Low back pain, unspecified: Secondary | ICD-10-CM | POA: Diagnosis not present

## 2024-04-30 DIAGNOSIS — G8929 Other chronic pain: Secondary | ICD-10-CM

## 2024-04-30 NOTE — Progress Notes (Signed)
 Pain Scale   Average Pain 4 Patient advised standing for a long period of time increases his pain. Patient advising when he sits his pain decreases. Patient is going PT however he has only attended 2 sessions         +Driver, -BT, -Dye Allergies.
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
## 2024-04-30 NOTE — Progress Notes (Signed)
 Core Outcome Measures Index (COMI) Back Score  Average Pain 7  COMI Score 50%

## 2024-05-01 ENCOUNTER — Ambulatory Visit

## 2024-05-01 ENCOUNTER — Telehealth: Payer: Self-pay

## 2024-05-01 NOTE — Telephone Encounter (Signed)
 Patient Name: Trevor Bean MRN: [id number] DOB:1966-08-22, 58 y.o., male Today's Date: 05/01/2024  Patient called in to cancel PT appt today as his brother passed away.   Raj LOISE Blanch, PT 05/01/2024, 7:11 AM
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
## 2024-05-04 ENCOUNTER — Ambulatory Visit
Admission: RE | Admit: 2024-05-04 | Discharge: 2024-05-04 | Disposition: A | Discharge status: Home / Self Care | Source: Ambulatory Visit | Attending: Physical Medicine and Rehabilitation | Admitting: Physical Medicine and Rehabilitation

## 2024-05-04 DIAGNOSIS — M47817 Spondylosis without myelopathy or radiculopathy, lumbosacral region: Secondary | ICD-10-CM

## 2024-05-04 DIAGNOSIS — M47816 Spondylosis without myelopathy or radiculopathy, lumbar region: Secondary | ICD-10-CM

## 2024-05-04 DIAGNOSIS — G8929 Other chronic pain: Secondary | ICD-10-CM

## 2024-05-05 ENCOUNTER — Ambulatory Visit

## 2024-05-05 ENCOUNTER — Telehealth: Payer: Self-pay

## 2024-05-05 NOTE — Telephone Encounter (Signed)
 Patient Name: KANISHK STROEBEL MRN: [id number] DOB:01-25-1966, 58 y.o., male Today's Date: 05/05/2024  Patient missed his PT appointment today at 7:15am. Today was his 1st no show. Patient was called phone rang and VM prompts were activated but then phone was disconnected. Unable to leave VM.   Raj LOISE Blanch, PT 05/05/2024, 7:41 AM

## 2024-05-08 ENCOUNTER — Ambulatory Visit

## 2024-05-12 ENCOUNTER — Ambulatory Visit

## 2024-05-12 DIAGNOSIS — M5459 Other low back pain: Secondary | ICD-10-CM

## 2024-05-12 DIAGNOSIS — M6281 Muscle weakness (generalized): Secondary | ICD-10-CM
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
## 2024-05-14 ENCOUNTER — Ambulatory Visit

## 2024-05-19 ENCOUNTER — Ambulatory Visit

## 2024-05-19 ENCOUNTER — Telehealth: Payer: Self-pay

## 2024-05-19 NOTE — Telephone Encounter (Signed)
 Patient Name: Trevor Bean MRN: [id number] DOB:1966/06/06, 58 y.o., male Today's Date: 05/19/2024  Patient missed his PT appointment today at 7:15am. Patient was called and left VM reminding patient that today was his last rescheduled appt. Due to multiple No shows, if he wishes to continue, we will only schedule one appt at a time. Pt was encouraged to call us  back to let us  know if he wishes to continue or to be discharged from PT. If we do not hear back from patient, patient will be discharged from skilled PT (2 weeks from today).    Raj LOISE Blanch, PT 05/19/2024, 1:05 PM

## 2024-06-03 ENCOUNTER — Ambulatory Visit: Admitting: Podiatry

## 2024-06-05 ENCOUNTER — Telehealth: Payer: Self-pay | Admitting: Family Medicine

## 2024-06-05 NOTE — Telephone Encounter (Signed)
 Copied from CRM #[id number]. Topic: Appointments - Scheduling Inquiry for Clinic >> Jun 05, 2024  9:42 AM Trevor Bean wrote:  Reason for CRM: Pt is interested in transferring his care from Dr. Delbert to Dr. Danton; however, Dr. Danton is not accepting new patients per [facility]. Pt wants to know if he can est care with her since he's already established with the office. Pt saw Dr. Danton before when Dr. Delbert wasn't available and felt he had a better patient experience with her. Please let pt know if he will be able to transfer care and schedule with Dr. Danton.

## 2024-06-05 NOTE — Telephone Encounter (Signed)
 Called patient verified name and date of birth. Informed patient that Jon Moores is no longer with our office. Patient requested for PCP change and is scheduled for an appointment with Dr.Johnson 09/24/2024 at 3:10 pm. Patient acknowledged.

## 2024-06-11 ENCOUNTER — Ambulatory Visit: Payer: Self-pay

## 2024-06-11 NOTE — Telephone Encounter (Signed)
 Noted
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
## 2024-06-15 ENCOUNTER — Ambulatory Visit (INDEPENDENT_AMBULATORY_CARE_PROVIDER_SITE_OTHER)

## 2024-06-15 ENCOUNTER — Ambulatory Visit (INDEPENDENT_AMBULATORY_CARE_PROVIDER_SITE_OTHER): Admitting: Podiatry

## 2024-06-15 ENCOUNTER — Encounter: Payer: Self-pay | Admitting: Podiatry

## 2024-06-15 DIAGNOSIS — M65971 Unspecified synovitis and tenosynovitis, right ankle and foot: Secondary | ICD-10-CM

## 2024-06-15 MED ORDER — BETAMETHASONE SOD PHOS & ACET 6 (3-3) MG/ML IJ SUSP: 3.0000 mg | Freq: Once | INTRAMUSCULAR | Status: AC
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
## 2024-06-16 ENCOUNTER — Telehealth: Payer: Self-pay | Admitting: Family Medicine

## 2024-06-16 NOTE — Telephone Encounter (Signed)
 Called patient, no answer. Left voicemail confirming upcoming appointment on 06/19/2024. Provided callback number for any questions or changes.

## 2024-06-19 ENCOUNTER — Ambulatory Visit: Attending: Family Medicine | Admitting: Internal Medicine

## 2024-06-19 ENCOUNTER — Encounter: Payer: Self-pay | Admitting: Internal Medicine

## 2024-06-19 VITALS — BP 109/63 | HR 57 | Temp 98.0°F | Ht 69.0 in | Wt 207.0 lb

## 2024-06-19 DIAGNOSIS — Z2821 Immunization not carried out because of patient refusal: Secondary | ICD-10-CM

## 2024-06-19 DIAGNOSIS — M545 Low back pain, unspecified: Secondary | ICD-10-CM | POA: Diagnosis not present

## 2024-06-19 DIAGNOSIS — G8929 Other chronic pain: Secondary | ICD-10-CM

## 2024-06-19 DIAGNOSIS — M47816 Spondylosis without myelopathy or radiculopathy, lumbar region: Secondary | ICD-10-CM
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
## 2024-06-19 NOTE — Patient Instructions (Signed)
 VISIT SUMMARY:  You came in today because you have been experiencing chronic lower back pain for the past two months, which started after lifting a washing machine. The pain is a burning sensation in your lower back and radiates down the back of your legs, especially when standing for long periods. Despite physical therapy and home exercises, the pain persists and affects your ability to stand for long periods at work.  YOUR PLAN:  -CHRONIC LOW BACK PAIN WITH LUMBAR SPONDYLOSIS: Lumbar spondylosis is a condition where there is wear and tear on the lower spine, causing pain. Your CT scan confirmed this diagnosis, and it did not show any slipped or herniated discs. We will refer you to orthopedics to see if injections or radiofrequency ablation might help. If orthopedics does not recommend these treatments and your pain continues, we may refer you to pain management. Please avoid heavy lifting to prevent worsening your back pain.  INSTRUCTIONS:  We will submit a referral to orthopedics for a follow-up based on your CT scan results. If orthopedics does not recommend injections and your pain persists, we will consider referring you to pain management.

## 2024-07-06 ENCOUNTER — Ambulatory Visit: Admitting: Nurse Practitioner

## 2024-07-10 ENCOUNTER — Encounter: Payer: Self-pay | Admitting: Physical Medicine and Rehabilitation

## 2024-07-10 ENCOUNTER — Ambulatory Visit: Admitting: Physical Medicine and Rehabilitation

## 2024-07-10 DIAGNOSIS — M47816 Spondylosis without myelopathy or radiculopathy, lumbar region: Secondary | ICD-10-CM | POA: Diagnosis not present

## 2024-07-10 DIAGNOSIS — G8929 Other chronic pain: Secondary | ICD-10-CM

## 2024-07-10 DIAGNOSIS — M47817 Spondylosis without myelopathy or radiculopathy, lumbosacral region: Secondary | ICD-10-CM

## 2024-07-10 DIAGNOSIS — M545 Low back pain, unspecified: Secondary | ICD-10-CM | POA: Diagnosis not present
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
## 2024-07-10 NOTE — Progress Notes (Signed)
 Pain Scale   Average Pain 6 Patient advising he has lower back pain that increases when standing, walking and does decrease when resting.        +Driver, -BT, -Dye Allergies.

## 2024-08-03 ENCOUNTER — Ambulatory Visit: Admitting: Physical Medicine and Rehabilitation

## 2024-08-03 ENCOUNTER — Other Ambulatory Visit: Payer: Self-pay

## 2024-08-03 VITALS — BP 124/74 | HR 60

## 2024-08-03 DIAGNOSIS — M47816 Spondylosis without myelopathy or radiculopathy, lumbar region: Secondary | ICD-10-CM

## 2024-08-03 MED ORDER — METHYLPREDNISOLONE ACETATE 80 MG/ML IJ SUSP
40.0000 mg | Freq: Once | INTRAMUSCULAR | Status: AC
  Administered 2024-08-03: 40 mg

## 2024-08-03 NOTE — Progress Notes (Signed)
 Pain Scale   Average Pain 5 Patient advising he has lower back pain radiating to right hip area with little to no relief.        +Driver, -BT, -Dye Allergies.

## 2024-08-10 ENCOUNTER — Ambulatory Visit: Attending: Family Medicine | Admitting: Family Medicine

## 2024-08-10 ENCOUNTER — Encounter: Payer: Self-pay | Admitting: Family Medicine

## 2024-08-10 VITALS — BP 109/70 | HR 63 | Temp 97.9°F | Ht 69.0 in | Wt 201.0 lb

## 2024-08-10 DIAGNOSIS — M19042 Primary osteoarthritis, left hand: Secondary | ICD-10-CM | POA: Diagnosis not present

## 2024-08-10 DIAGNOSIS — G8929 Other chronic pain: Secondary | ICD-10-CM | POA: Diagnosis not present

## 2024-08-10 DIAGNOSIS — Z0001 Encounter for general adult medical examination with abnormal findings: Secondary | ICD-10-CM | POA: Diagnosis not present

## 2024-08-10 DIAGNOSIS — M19041 Primary osteoarthritis, right hand: Secondary | ICD-10-CM

## 2024-08-10 DIAGNOSIS — Z2821 Immunization not carried out because of patient refusal: Secondary | ICD-10-CM | POA: Diagnosis not present

## 2024-08-10 DIAGNOSIS — Z125 Encounter for screening for malignant neoplasm of prostate: Secondary | ICD-10-CM

## 2024-08-10 NOTE — Procedures (Signed)
 Lumbar Facet Joint Intra-Articular Injection(s) with Fluoroscopic Guidance  Patient: Trevor Bean      Date of Birth: 08/22/1966 MRN: [id number] PCP: Vicci Barnie NOVAK, MD      Visit Date: 08/03/2024   Universal Protocol:    Date/Time: 08/03/2024  Consent Given By: the patient  Position: PRONE   Additional Comments: Vital signs were monitored before and after the procedure. Patient was prepped and draped in the usual sterile fashion. The correct patient, procedure, and site was verified.   Injection Procedure Details:  Procedure Site One Meds Administered:  Meds ordered this encounter  Medications   methylPREDNISolone  acetate (DEPO-MEDROL ) injection 40 mg     Laterality: Bilateral  Location/Site:  L4-L5 L5-S1  Needle size: 22 guage  Needle type: Spinal  Needle Placement: Articular  Findings:  -Comments: Excellent flow of contrast producing a partial arthrogram.  Procedure Details: The fluoroscope beam is vertically oriented in AP, and the inferior recess is visualized beneath the lower pole of the inferior apophyseal process, which represents the target point for needle insertion. When direct visualization is difficult the target point is located at the medial projection of the vertebral pedicle. The region overlying each aforementioned target is locally anesthetized with a 1 to 2 ml. volume of 1% Lidocaine  without Epinephrine.   The spinal needle was inserted into each of the above mentioned facet joints using biplanar fluoroscopic guidance. A 0.25 to 0.5 ml. volume of Isovue-250 was injected and a partial facet joint arthrogram was obtained. A single spot film was obtained of the resulting arthrogram.    One to 1.25 ml of the steroid/anesthetic solution was then injected into each of the facet joints noted above.   Additional Comments:  The patient tolerated the procedure well Dressing: 2 x 2 sterile gauze and Band-Aid    Post-procedure details: Patient  was observed during the procedure. Post-procedure instructions were reviewed.  Patient left the clinic in stable condition.
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
## 2024-08-10 NOTE — Patient Instructions (Signed)

## 2024-08-11 ENCOUNTER — Ambulatory Visit: Payer: Self-pay | Admitting: Family Medicine

## 2024-08-11 LAB — PSA, TOTAL AND FREE
PSA, Free Pct: 22 %
PSA, Free: 0.11 ng/mL
Prostate Specific Ag, Serum: 0.5 ng/mL (ref 0.0–4.0)

## 2024-08-17 ENCOUNTER — Ambulatory Visit
Admission: RE | Admit: 2024-08-17 | Discharge: 2024-08-17 | Disposition: A | Discharge status: Home / Self Care | Source: Ambulatory Visit | Attending: Family Medicine | Admitting: Family Medicine

## 2024-08-17 DIAGNOSIS — M19041 Primary osteoarthritis, right hand: Secondary | ICD-10-CM

## 2024-08-24 ENCOUNTER — Encounter: Payer: Self-pay | Admitting: Radiology

## 2024-09-22 ENCOUNTER — Other Ambulatory Visit: Payer: Self-pay

## 2024-09-22 ENCOUNTER — Other Ambulatory Visit (HOSPITAL_COMMUNITY): Admission: RE | Admit: 2024-09-22 | Source: Ambulatory Visit

## 2024-09-22 ENCOUNTER — Ambulatory Visit: Attending: Internal Medicine | Admitting: Internal Medicine

## 2024-09-22 ENCOUNTER — Encounter: Payer: Self-pay | Admitting: Internal Medicine

## 2024-09-22 VITALS — BP 122/69 | HR 52 | Temp 97.7°F | Ht 69.0 in | Wt 204.0 lb

## 2024-09-22 DIAGNOSIS — M19042 Primary osteoarthritis, left hand: Secondary | ICD-10-CM | POA: Insufficient documentation

## 2024-09-22 DIAGNOSIS — E785 Hyperlipidemia, unspecified: Secondary | ICD-10-CM | POA: Insufficient documentation

## 2024-09-22 DIAGNOSIS — R300 Dysuria: Secondary | ICD-10-CM | POA: Diagnosis not present

## 2024-09-22 DIAGNOSIS — Z803 Family history of malignant neoplasm of breast: Secondary | ICD-10-CM | POA: Insufficient documentation

## 2024-09-22 DIAGNOSIS — M19041 Primary osteoarthritis, right hand: Secondary | ICD-10-CM | POA: Diagnosis not present

## 2024-09-22 DIAGNOSIS — Z8042 Family history of malignant neoplasm of prostate: Secondary | ICD-10-CM | POA: Insufficient documentation

## 2024-09-22 DIAGNOSIS — Z8041 Family history of malignant neoplasm of ovary: Secondary | ICD-10-CM | POA: Insufficient documentation

## 2024-09-22 DIAGNOSIS — M47816 Spondylosis without myelopathy or radiculopathy, lumbar region: Secondary | ICD-10-CM | POA: Insufficient documentation

## 2024-09-22 DIAGNOSIS — M545 Low back pain, unspecified: Secondary | ICD-10-CM | POA: Insufficient documentation

## 2024-09-22 DIAGNOSIS — Z113 Encounter for screening for infections with a predominantly sexual mode of transmission: Secondary | ICD-10-CM | POA: Diagnosis not present

## 2024-09-22 DIAGNOSIS — R7303 Prediabetes: Secondary | ICD-10-CM | POA: Insufficient documentation

## 2024-09-22 DIAGNOSIS — G8929 Other chronic pain: Secondary | ICD-10-CM | POA: Insufficient documentation

## 2024-09-22 DIAGNOSIS — Z202 Contact with and (suspected) exposure to infections with a predominantly sexual mode of transmission: Secondary | ICD-10-CM | POA: Insufficient documentation

## 2024-09-22 DIAGNOSIS — Z1159 Encounter for screening for other viral diseases: Secondary | ICD-10-CM | POA: Insufficient documentation

## 2024-09-22 DIAGNOSIS — Z79899 Other long term (current) drug therapy: Secondary | ICD-10-CM | POA: Insufficient documentation

## 2024-09-22 DIAGNOSIS — A599 Trichomoniasis, unspecified: Secondary | ICD-10-CM | POA: Insufficient documentation

## 2024-09-22 MED ORDER — CELECOXIB 200 MG PO CAPS
200.0000 mg | ORAL_CAPSULE | Freq: Every day | ORAL | 5 refills | Status: AC
  Filled 2024-09-22: qty 30, 30d supply, fill #0
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
## 2024-09-22 NOTE — Patient Instructions (Signed)
  VISIT SUMMARY: During your visit, we discussed your concerns about possible exposure to a sexually transmitted infection (STI) and your symptoms of tingling and burning during urination. We also addressed your severe hand pain due to osteoarthritis and your chronic low back pain with spondylosis.  YOUR PLAN: -SEXUALLY TRANSMITTED INFECTION EXPOSURE WITH DYSURIA: You may have been exposed to a sexually transmitted infection (STI), which is causing discomfort when you urinate. We have ordered urine tests for chlamydia, gonorrhea, trichomonas, and a urinary tract infection, as well as blood tests for HIV, syphilis, and hepatitis C. We will send the results to your MyChart account and prescribe antibiotics if an infection is confirmed.  -OSTEOARTHRITIS OF HANDS: Osteoarthritis is a condition where the cartilage in your joints wears down, causing pain and stiffness. For your severe hand pain, we have prescribed Celebrex or meloxicam  for pain management and advised you to stop taking ibuprofen  due to its side effects.  -CHRONIC LOW BACK PAIN WITH SPONDYLOSIS: Spondylosis is a type of arthritis that affects the spine, causing chronic back pain. For your low back pain, we have prescribed Celebrex or meloxicam  for pain management and referred you to a pain management specialist.  INSTRUCTIONS: Please follow up with the pain management specialist as referred. We will notify you through your MyChart account with the results of your tests for sexually transmitted infections and urinary tract infection. If an infection is confirmed, we will prescribe the appropriate antibiotics.                      Contains text generated by Abridge.                                 Contains text generated by Abridge.

## 2024-09-23 ENCOUNTER — Ambulatory Visit: Payer: Self-pay | Admitting: Internal Medicine

## 2024-09-23 ENCOUNTER — Ambulatory Visit: Admitting: Family Medicine

## 2024-09-23 LAB — URINALYSIS, ROUTINE W REFLEX MICROSCOPIC
Bilirubin, UA: NEGATIVE
Glucose, UA: NEGATIVE
Ketones, UA: NEGATIVE
Nitrite, UA: NEGATIVE
Protein,UA: NEGATIVE
RBC, UA: NEGATIVE
Specific Gravity, UA: 1.016 (ref 1.005–1.030)
Urobilinogen, Ur: 0.2 mg/dL (ref 0.2–1.0)
pH, UA: 6 (ref 5.0–7.5)

## 2024-09-23 LAB — MICROSCOPIC EXAMINATION
Bacteria, UA: NONE SEEN
Casts: NONE SEEN /LPF
RBC, Urine: NONE SEEN /HPF (ref 0–2)
WBC, UA: NONE SEEN /HPF (ref 0–5)

## 2024-09-23 LAB — URINE CYTOLOGY ANCILLARY ONLY
Chlamydia: NEGATIVE
Comment: NEGATIVE
Comment: NEGATIVE
Comment: NORMAL
Neisseria Gonorrhea: NEGATIVE
Trichomonas: POSITIVE — AB

## 2024-09-23 LAB — HEPATITIS C ANTIBODY: Hep C Virus Ab: NONREACTIVE

## 2024-09-23 LAB — SYPHILIS: RPR W/REFLEX TO RPR TITER AND TREPONEMAL ANTIBODIES, TRADITIONAL SCREENING AND DIAGNOSIS ALGORITHM: RPR Ser Ql: NONREACTIVE

## 2024-09-23 LAB — HIV ANTIBODY (ROUTINE TESTING W REFLEX): HIV Screen 4th Generation wRfx: NONREACTIVE

## 2024-09-24 ENCOUNTER — Ambulatory Visit: Admitting: Internal Medicine

## 2024-09-24 ENCOUNTER — Other Ambulatory Visit: Payer: Self-pay

## 2024-09-24 ENCOUNTER — Encounter: Payer: Self-pay | Admitting: Internal Medicine

## 2024-09-24 ENCOUNTER — Other Ambulatory Visit: Payer: Self-pay | Admitting: Internal Medicine

## 2024-09-24 MED ORDER — METRONIDAZOLE 500 MG PO TABS
500.0000 mg | ORAL_TABLET | Freq: Two times a day (BID) | ORAL | 0 refills | Status: AC
  Filled 2024-09-24: qty 14, 7d supply, fill #0

## 2024-09-25 NOTE — Telephone Encounter (Signed)
 Patient has seen results & provider message via Mychart.  Dr.Johnson's message is as follows: You tested positive for trichomonas which is a sexually transmitted infection. I will send a prescription to our pharmacy for an antibiotic called metronidazole  to treat this infection. Please pick up the prescription. Your partner should also be treated before having unprotected sex again.    No further assistance need at this time.

## 2024-10-21 ENCOUNTER — Encounter: Payer: Self-pay | Admitting: Physician Assistant

## 2024-10-21 ENCOUNTER — Ambulatory Visit: Payer: Self-pay

## 2024-10-21 ENCOUNTER — Other Ambulatory Visit: Payer: Self-pay

## 2024-10-21 ENCOUNTER — Ambulatory Visit: Admitting: Physician Assistant

## 2024-10-21 ENCOUNTER — Ambulatory Visit
Admission: RE | Admit: 2024-10-21 | Discharge: 2024-10-21 | Disposition: A | Discharge status: Home / Self Care | Source: Ambulatory Visit | Attending: Physician Assistant | Admitting: Physician Assistant

## 2024-10-21 VITALS — BP 123/76 | HR 56 | Ht 69.0 in | Wt 200.0 lb

## 2024-10-21 DIAGNOSIS — M25561 Pain in right knee: Secondary | ICD-10-CM

## 2024-10-21 MED ORDER — METHYLPREDNISOLONE 4 MG PO TBPK
ORAL_TABLET | ORAL | 0 refills | Status: AC
  Filled 2024-10-21: qty 21, 6d supply, fill #0

## 2024-10-21 NOTE — Patient Instructions (Addendum)
 VISIT SUMMARY:  Trevor Bean, you visited today due to right knee pain following a fall. You tripped on the stairs four days ago, resulting in significant swelling, stiffness, and pain in your right knee. The swelling has improved with ice, but the knee remains stiff and mildly puffy. You have been using muscle cream without relief and take Celebrex  as needed for arthritis pain.  YOUR PLAN:  -ACUTE RIGHT KNEE INJURY: You have an acute injury to your right knee, which means it is a recent injury causing significant pain, swelling, and stiffness. We have ordered an x-ray to get a better look at the injury. You will start a prednisone  taper to help reduce swelling and inflammation. Continue taking Celebrex  for additional pain relief if needed. Use a knee brace for support, and make sure to rest, ice, and elevate your knee. If your symptoms worsen or if you notice increased swelling, please follow up with orthopedic urgent care   Acute Knee Pain, Adult Many things can cause knee pain. Sometimes, knee pain is sudden (acute). It may be caused by damage, swelling, or irritation of the muscles and tissues that support your knee. Pain may come from: A fall. An injury to the knee from twisting motions. A hit to the knee. Infection. The pain often goes away on its own with time and rest. If the pain does not go away, tests may be done to find out what is causing the pain. These may include: Imaging tests, such as an X-ray, MRI, CT scan, or ultrasound. Joint aspiration. In this test, fluid is removed from the knee and checked. Arthroscopy. In this test, a lighted tube is put in the knee and an image is shown on a screen. A biopsy. In this test, a health care provider will remove a small piece of tissue for testing. Follow these instructions at home: If you have a knee sleeve or brace that can be taken off:  Wear the knee sleeve or brace as told by your provider. Take it off only if your provider says  that you can. Check the skin around it every day. Tell your provider if you see problems. Loosen the knee sleeve or brace if your toes tingle, are numb, or turn cold and blue. Keep the knee sleeve or brace clean and dry. Bathing If the knee sleeve or brace is not waterproof: Do not let it get wet. Cover it when you take a bath or shower. Use a cover that does not let any water in. Managing pain, stiffness, and swelling  If told, put ice on the area. If you have a knee sleeve or brace that you can take off, remove it as told. Put ice in a plastic bag. Place a towel between your skin and the bag. Leave the ice on for 20 minutes, 2-3 times a day. If your skin turns bright red, take off the ice right away to prevent skin damage. The risk of damage is higher if you cannot feel pain, heat, or cold. Move your toes often to reduce stiffness and swelling. Raise the injured area above the level of your heart while you are sitting or lying down. Use a pillow to support your foot as needed. If told, use an elastic bandage to put pressure (compression) on your injured knee. This may control swelling, give support, and help with discomfort. Sleep with a pillow under your knee. Activity Rest your knee. Do not do things that cause pain or make pain worse. Do  not stand or walk on your injured knee until you're told it's okay. Use crutches as told. Avoid activities where both feet leave the ground at the same time and put stress on the joints. Avoid running, jumping rope, and doing jumping jacks. Work with a physical therapist to make a safe exercise program if told. Physical therapy helps your knee move better and get stronger. Exercise as told. General instructions Take your medicines only as told by your provider. If you are overweight, work with your provider and an expert in healthy eating, called a dietician, to set goals to lose weight. Being overweight can make your knee hurt more. Do not smoke,  vape, or use products with nicotine or tobacco in them. If you need help quitting, talk with your provider. Return to normal activities when you are told. Ask what things are safe for you to do. Watch for any changes in your symptoms. Keep all follow-up visits. Your provider will check your healing and adjust treatments if needed. Contact a health care provider if: The knee pain does not stop. The knee pain changes or gets worse. You have a fever along with knee pain. Your knee is red or feels warm when you touch it. Your knee gives out or locks up. Get help right away if: Your knee swells and the swelling gets worse. You cannot move your knee. You have very bad knee pain that does not get better with medicine. This information is not intended to replace advice given to you by your health care provider. Make sure you discuss any questions you have with your health care provider. Document Revised: 07/11/2023 Document Reviewed: 12/03/2022 Elsevier Patient Education  2024 [employer].

## 2024-10-21 NOTE — Telephone Encounter (Signed)
 FYI Only or Action Required?: FYI only for provider: recommended to mobile medicine bus-location information given, patient states he will head there.  Patient was last seen in primary care on 09/22/2024 by Vicci Barnie NOVAK, MD.  Called Nurse Triage reporting Knee Pain.  Symptoms began several days ago.  Interventions attempted: Rest, hydration, or home remedies.  Symptoms are: unchanged.  Triage Disposition: See HCP Within 4 Hours (Or PCP Triage)  Patient/caregiver understands and will follow disposition?: Yes  Copied from CRM #[id number]. Topic: Clinical - Red Word Triage >> Oct 21, 2024  9:12 AM Selinda RAMAN wrote: Red Word that prompted transfer to Nurse Triage: The patient called in stating he fell up some steps this past Saturday 10/17/24 and he had bad right knee pain and swelling ever since. He would like to speak with a nurse as he now has an appointment with a sports medicine or orthopedic provider but it is not until the end of January. I will transfer him to [facility] Reason for Disposition  [1] Can't move swollen joint at all AND [2] no fever  Answer Assessment - Initial Assessment Questions Patient reports he fell up steps this past Saturday. Reports right knee pain and swelling. Reports swelling to the back of knee as well  1. LOCATION and RADIATION: Where is the pain located?      Right knee  2. QUALITY: What does the pain feel like?  (e.g., sharp, dull, aching, burning)     sharp 3. SEVERITY: How bad is the pain? What does it keep you from doing?   (Scale 1-10; or mild, moderate, severe)     6-7 out of 10 4. ONSET: When did the pain start? Does it come and go, or is it there all the time?     10/17/2024 5. RECURRENT: Have you had this pain before? If Yes, ask: When, and what happened then?     no 6. SETTING: Has there been any recent work, exercise or other activity that involved that part of the body?      Fell up some steps 7. AGGRAVATING FACTORS:  What makes the knee pain worse? (e.g., walking, climbing stairs, running)     Bending his knee 8. ASSOCIATED SYMPTOMS: Is there any swelling or redness of the knee?     swelling 9. OTHER SYMPTOMS: Do you have any other symptoms? (e.g., calf pain, chest pain, difficulty breathing, fever)     no  Protocols used: Knee Pain-A-AH
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
## 2024-10-24 ENCOUNTER — Encounter: Payer: Self-pay | Admitting: Internal Medicine

## 2024-10-26 ENCOUNTER — Encounter: Payer: Self-pay | Admitting: Physician Assistant

## 2024-10-26 DIAGNOSIS — M25561 Pain in right knee: Secondary | ICD-10-CM

## 2024-10-27 NOTE — Telephone Encounter (Signed)
 Attempting to contact imaging center and keep getting VM

## 2024-10-28 ENCOUNTER — Ambulatory Visit: Payer: Self-pay | Admitting: Physician Assistant

## 2024-10-28 NOTE — Telephone Encounter (Signed)
 Called the reading room [phone], to have results released to ordering provider.  Spk with Stacy.

## 2024-11-02 ENCOUNTER — Other Ambulatory Visit: Payer: Self-pay

## 2024-11-02 ENCOUNTER — Ambulatory Visit: Admitting: Physician Assistant

## 2024-11-03 ENCOUNTER — Ambulatory Visit: Admitting: Physician Assistant

## 2024-11-04 ENCOUNTER — Telehealth: Payer: Self-pay | Admitting: Internal Medicine

## 2024-11-04 NOTE — Telephone Encounter (Signed)
Letter sent to pt via Mychart

## 2024-11-06 ENCOUNTER — Ambulatory Visit: Payer: Self-pay | Admitting: Internal Medicine

## 2024-11-10 ENCOUNTER — Encounter: Payer: Self-pay | Admitting: Internal Medicine

## 2024-11-10 ENCOUNTER — Ambulatory Visit: Attending: Internal Medicine | Admitting: Internal Medicine

## 2024-11-10 ENCOUNTER — Ambulatory Visit: Admitting: Internal Medicine

## 2024-11-10 VITALS — BP 118/71 | HR 57 | Temp 97.6°F | Ht 69.0 in | Wt 205.0 lb

## 2024-11-10 DIAGNOSIS — M25561 Pain in right knee: Secondary | ICD-10-CM | POA: Diagnosis not present
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
## 2024-11-10 NOTE — Patient Instructions (Addendum)
" °  VISIT SUMMARY: During your visit, we discussed your knee pain and swelling following a fall. You have been experiencing significant discomfort, particularly when standing for long periods at work. X-rays showed fluid in your knee and signs of arthritis. We have developed a plan to manage your symptoms and address the underlying issues.  YOUR PLAN: -RIGHT KNEE PAIN AND SWELLING AFTER FALL: We have ordered an MRI to get a clearer picture of the issue. Please keep your orthopedics appointment on January 23rd, 2026. In the meantime, continue using ice to reduce swelling and try to rest your knee as much as possible. We have provided a work excuse note for your time off and requested light duty with intermittent sitting for six weeks to help manage your pain at work.   INSTRUCTIONS: Please follow up with the orthopedic specialist on January 23rd, 2026, as scheduled. An MRI of your right knee has been ordered to further evaluate the extent of the injury. Continue using ice to manage swelling and rest your knee as much as possible. We have requested light duty at work with intermittent sitting for the next six weeks to help alleviate your symptoms.    Contains text generated by [employer].   "

## 2024-11-10 NOTE — Telephone Encounter (Signed)
 Patient was called & scheduled for an in-person visit today 11/10/2024. All questions / concerns will be addressed at today's appointment. No further assistance needed.

## 2024-11-13 ENCOUNTER — Ambulatory Visit: Admitting: Physician Assistant

## 2024-11-17 ENCOUNTER — Ambulatory Visit (HOSPITAL_BASED_OUTPATIENT_CLINIC_OR_DEPARTMENT_OTHER): Admitting: Family Medicine

## 2024-11-18 ENCOUNTER — Telehealth: Payer: Self-pay | Admitting: Internal Medicine

## 2024-11-18 NOTE — Telephone Encounter (Signed)
 Form successfully faxed to [employer] - Ms.Murbach on 11/18/24. Fax #: [phone]. Mychart response sent to patient on 11/18/24. No futher assistance needed at this time.

## 2024-11-18 NOTE — Telephone Encounter (Signed)
 Copied from CRM #[id number]. Topic: General - Other >> Nov 18, 2024  8:47 AM   Myrick T wrote:  Reason for CRM: patient called stated he need provider to send his paperwork to [employer] where he was taken out of work January 9th-16rh. He also stated he sent a mychart message on 1/27 at 11:31am and again this morning. I can't see the mychart message. Please f/u with patient

## 2024-11-20 ENCOUNTER — Encounter: Payer: Self-pay | Admitting: Internal Medicine

## 2024-11-25 ENCOUNTER — Ambulatory Visit
Admission: RE | Admit: 2024-11-25 | Discharge: 2024-11-25 | Disposition: A | Source: Ambulatory Visit | Attending: Internal Medicine | Admitting: Internal Medicine

## 2024-11-25 DIAGNOSIS — M25561 Pain in right knee: Secondary | ICD-10-CM

## 2024-12-21 ENCOUNTER — Ambulatory Visit: Admitting: Internal Medicine
